# Patient Record
Sex: Male | Born: 1960
Health system: Southern US, Community
[De-identification: ages and names within clinical notes are randomized; demographics above are authoritative.]

## PROBLEM LIST (undated history)

## (undated) ENCOUNTER — Emergency Department: Discharge status: Absent without leave | Payer: 59

## (undated) DIAGNOSIS — M48062 Spinal stenosis, lumbar region with neurogenic claudication: Secondary | ICD-10-CM

## (undated) DIAGNOSIS — D72819 Decreased white blood cell count, unspecified: Secondary | ICD-10-CM

## (undated) DIAGNOSIS — H409 Unspecified glaucoma: Secondary | ICD-10-CM

## (undated) DIAGNOSIS — E785 Hyperlipidemia, unspecified: Secondary | ICD-10-CM

## (undated) DIAGNOSIS — R739 Hyperglycemia, unspecified: Secondary | ICD-10-CM

## (undated) HISTORY — DX: Unspecified glaucoma: H40.9

## (undated) HISTORY — DX: Hyperglycemia, unspecified: R73.9

## (undated) HISTORY — DX: Spinal stenosis, lumbar region with neurogenic claudication: M48.062

## (undated) HISTORY — DX: Hyperlipidemia, unspecified: E78.5

## (undated) HISTORY — DX: Decreased white blood cell count, unspecified: D72.819

---
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
## 2005-10-22 ENCOUNTER — Ambulatory Visit: Payer: Self-pay | Admitting: Internal Medicine

## 2006-01-04 ENCOUNTER — Encounter: Payer: Self-pay | Admitting: Family Medicine

## 2006-01-04 LAB — CONVERTED CEMR LAB: PSA: 1.43 ng/mL

## 2006-01-07 ENCOUNTER — Ambulatory Visit: Payer: Self-pay | Admitting: Family Medicine

## 2006-01-13 ENCOUNTER — Ambulatory Visit: Payer: Self-pay | Admitting: Family Medicine

## 2006-10-13 ENCOUNTER — Ambulatory Visit: Payer: Self-pay | Admitting: Family Medicine

## 2006-10-13 DIAGNOSIS — M545 Low back pain, unspecified: Secondary | ICD-10-CM | POA: Insufficient documentation

## 2006-10-13 DIAGNOSIS — L723 Sebaceous cyst: Secondary | ICD-10-CM | POA: Insufficient documentation

## 2006-11-10 ENCOUNTER — Telehealth: Payer: Self-pay | Admitting: Family Medicine

## 2006-11-12 ENCOUNTER — Encounter: Payer: Self-pay | Admitting: Family Medicine

## 2006-11-12 DIAGNOSIS — K219 Gastro-esophageal reflux disease without esophagitis: Secondary | ICD-10-CM | POA: Insufficient documentation

## 2006-11-14 ENCOUNTER — Ambulatory Visit: Payer: Self-pay | Admitting: Family Medicine

## 2007-07-03 ENCOUNTER — Ambulatory Visit: Payer: Self-pay | Admitting: Family Medicine

## 2007-07-21 ENCOUNTER — Ambulatory Visit: Payer: Self-pay | Admitting: Family Medicine

## 2007-07-24 LAB — CONVERTED CEMR LAB
ALT: 28 units/L (ref 0–53)
AST: 23 units/L (ref 0–37)
Albumin: 3.9 g/dL (ref 3.5–5.2)
Alkaline Phosphatase: 48 units/L (ref 39–117)
BUN: 12 mg/dL (ref 6–23)
Bilirubin, Direct: 0.1 mg/dL (ref 0.0–0.3)
CO2: 31 meq/L (ref 19–32)
Calcium: 9.1 mg/dL (ref 8.4–10.5)
Chloride: 104 meq/L (ref 96–112)
Cholesterol: 231 mg/dL (ref 0–200)
Creatinine, Ser: 1.1 mg/dL (ref 0.4–1.5)
Direct LDL: 171.1 mg/dL
GFR calc Af Amer: 92 mL/min
GFR calc non Af Amer: 76 mL/min
Glucose, Bld: 100 mg/dL — ABNORMAL HIGH (ref 70–99)
HDL: 41.8 mg/dL (ref >39.0)
PSA: 1.29 ng/mL (ref 0.10–4.00)
Potassium: 4.1 meq/L (ref 3.5–5.1)
Sodium: 141 meq/L (ref 135–145)
Total Bilirubin: 0.7 mg/dL (ref 0.3–1.2)
Total CHOL/HDL Ratio: 5.5
Total Protein: 7.1 g/dL (ref 6.0–8.3)
Triglycerides: 142 mg/dL (ref 0–149)
VLDL: 28 mg/dL (ref 0–40)

## 2007-10-28 ENCOUNTER — Ambulatory Visit: Payer: Self-pay | Admitting: Family Medicine

## 2007-11-05 LAB — CONVERTED CEMR LAB
Cholesterol: 221 mg/dL (ref 0–200)
Direct LDL: 175.5 mg/dL
HDL: 45.2 mg/dL (ref >39.0)
Total CHOL/HDL Ratio: 4.9
Triglycerides: 71 mg/dL (ref 0–149)
VLDL: 14 mg/dL (ref 0–40)

## 2008-02-02 ENCOUNTER — Ambulatory Visit: Payer: Self-pay | Admitting: Family Medicine

## 2008-02-04 LAB — CONVERTED CEMR LAB
ALT: 35 units/L (ref 0–53)
AST: 25 units/L (ref 0–37)
Cholesterol: 258 mg/dL (ref 0–200)
Direct LDL: 176.3 mg/dL
HDL: 61.8 mg/dL (ref >39.0)
Total CHOL/HDL Ratio: 4.2
Triglycerides: 61 mg/dL (ref 0–149)
VLDL: 12 mg/dL (ref 0–40)

## 2008-05-26 ENCOUNTER — Ambulatory Visit: Payer: Self-pay | Admitting: Family Medicine

## 2008-05-27 LAB — CONVERTED CEMR LAB
ALT: 33 U/L
AST: 23 U/L
Cholesterol: 162 mg/dL
HDL: 45.7 mg/dL
LDL Cholesterol: 102 mg/dL — ABNORMAL HIGH
Total CHOL/HDL Ratio: 4
Triglycerides: 70 mg/dL
VLDL: 14 mg/dL

## 2008-06-21 ENCOUNTER — Ambulatory Visit: Payer: Self-pay | Admitting: Family Medicine

## 2008-06-21 DIAGNOSIS — J301 Allergic rhinitis due to pollen: Secondary | ICD-10-CM | POA: Insufficient documentation

## 2008-06-21 DIAGNOSIS — J309 Allergic rhinitis, unspecified: Secondary | ICD-10-CM | POA: Insufficient documentation

## 2008-12-09 ENCOUNTER — Ambulatory Visit: Payer: Self-pay | Admitting: Family Medicine

## 2008-12-09 DIAGNOSIS — M79609 Pain in unspecified limb: Secondary | ICD-10-CM | POA: Insufficient documentation

## 2008-12-09 DIAGNOSIS — R42 Dizziness and giddiness: Secondary | ICD-10-CM | POA: Insufficient documentation

## 2008-12-14 ENCOUNTER — Ambulatory Visit: Payer: Self-pay | Admitting: Family Medicine

## 2008-12-15 ENCOUNTER — Encounter (INDEPENDENT_AMBULATORY_CARE_PROVIDER_SITE_OTHER): Payer: Self-pay | Admitting: *Deleted

## 2008-12-15 LAB — CONVERTED CEMR LAB: Fecal Occult Bld: NEGATIVE

## 2010-03-06 NOTE — Assessment & Plan Note (Signed)
Summary: CPX/CLE   Vital Signs:  Patient profile:   50 year old male Height:      70 inches Weight:      215.0 pounds BMI:     30.96 Temp:     98.2 degrees F oral Pulse rate:   60 / minute Pulse rhythm:   regular BP sitting:   120 / 82  (left arm) Cuff size:   large  Vitals Entered By: Benny Lennert CMA (AAMA) (December 09, 2008 2:02 PM)  History of Present Illness: Chief complain cpx The patient is here for annual wellness exam and preventative care.      In past week has been having sore throat, congestion, chills. No cough, no SOB. Slight subjectivefever.  No specific sick contacts. No allergy symptoms.  Allegra has helped some   Continues to have occassional lightheadedness. Occurs when sitting occ with stabding.Marland Kitchendescribes as feeling off balance.   Has been having several weeks of calf tightness. Worst when getting up in AM. No cramps.    Problems Prior to Update: 1)  Allergic Rhinitis  (ICD-477.9) 2)  Uri  (ICD-465.9) 3)  Screening For Lipoid Disorders  (ICD-V77.91) 4)  Special Screening Malignant Neoplasm of Prostate  (ICD-V76.44) 5)  Well Woman  (ICD-V70.0) 6)  Obesity, Bmi 30  (ICD-278.00) 7)  Hyperlipidemia  (ICD-272.4) 8)  Gerd  (ICD-530.81) 9)  Asthma, Childhood  (ICD-493.90) 10)  Back Pain, Lumbar  (ICD-724.2) 11)  Sebaceous Cyst  (ICD-706.2)  Current Medications (verified): 1)  Multivitamins   Tabs (Multiple Vitamin) .... Once Daily 2)  Omega-3 350 Mg  Caps (Omega-3 Fatty Acids) .... Once Daily 3)  Prevacid 15 Mg  Cpdr (Lansoprazole) .... As Needed 4)  Simvastatin 40 Mg Tabs (Simvastatin) .Marland Kitchen.. 1 Tab By Mouth Daily 5)  Allegra 180 Mg Tabs (Fexofenadine Hcl) .Marland Kitchen.. 1 Once Daily For Allergic Rhinitis 6)  Sudafed 120mg   Allergies: 1)  ! * Hard Shell Fish// Shrimp/lobster/crabs  Past History:  Past medical, surgical, family and social histories (including risk factors) reviewed, and no changes noted (except as noted below).  Past Medical  History: Reviewed history from 11/12/2006 and no changes required. Asthma GERD Hyperlipidemia  Past Surgical History: Reviewed history from 11/12/2006 and no changes required. 2006    EGD (-) 1970's Hernia op. 2007    MRI, left knee  Family History: Reviewed history from 11/12/2006 and no changes required. Father: Alive 43, healthy Mother: Alive 18, HTN Siblings: 5 brothers, 2 sisters with HTN No MI < 55 Lung CA:  MGM  Social History: Reviewed history from 11/12/2006 and no changes required. Never Smoked Alcohol use-yes, 1-2 beers/day, some liquor on weekend (1-2 drinks) Drug use-no Regular exercise-yes, walking daily 1 mile Marital Status: Married x 25 years Children: 3 kids, ages 44 - 20 Occupation: Home improvement Diet:  Varied, no FF, fruit and veggies  Review of Systems General:  Denies fatigue and fever. CV:  Denies chest pain or discomfort. Resp:  Denies shortness of breath. GI:  Denies abdominal pain, bloody stools, constipation, and diarrhea. GU:  Denies dysuria.  Physical Exam  General:  Well-developed,well-nourished,in no acute distress; alert,appropriate and cooperative throughout examination Head:  Normocephalic and atraumatic without obvious abnormalities. No apparent alopecia or balding. Eyes:  No corneal or conjunctival inflammation noted. EOMI. Perrla. Funduscopic exam benign, without hemorrhages, exudates or papilledema. Vision grossly normal. Ears:  clear fluid B TMS Nose:  nasal dischargemucosal pallor.   Mouth:  post nassal drip, oropharyngeal erythema Neck:  no carotid  bruit or thyromegaly no cervical or supraclavicular lymphadenopathy  Lungs:  Normal respiratory effort, chest expands symmetrically. Lungs are clear to auscultation, no crackles or wheezes. Heart:  Normal rate and regular rhythm. S1 and S2 normal without gallop, murmur, click, rub or other extra sounds. Abdomen:  Bowel sounds positive,abdomen soft and non-tender without masses,  organomegaly or hernias noted. Rectal:  No external abnormalities noted. Normal sphincter tone. No rectal masses or tenderness. Genitalia:  Testes bilaterally descended without nodularity, tenderness or masses. No scrotal masses or lesions. No penis lesions or urethral discharge. Prostate:  Prostate gland firm and smooth, no enlargement, nodularity, tenderness, mass, asymmetry or induration. Msk:  No deformity or scoliosis noted of thoracic or lumbar spine.   Pulses:  R and L posterior tibial pulses are full and equal bilaterally  Extremities:  no edema Neurologic:  No cranial nerve deficits noted. Station and gait are normal. Plantar reflexes are down-going bilaterally. DTRs are symmetrical throughout. Sensory, motor and coordinative functions appear intact. Skin:  Intact without suspicious lesions or rashes Psych:  Cognition and judgment appear intact. Alert and cooperative with normal attention span and concentration. No apparent delusions, illusions, hallucinations   Impression & Recommendations:  Problem # 1:  PREVENTIVE HEALTH CARE (ICD-V70.0)  Reviewed preventive care protocols, scheduled due services, and updated immunizations. Encouraged exercise, weight loss, healthy eating habits.   Problem # 2:  DIZZINESS (ICD-780.4) Unclear if inner ear issue/due to allergies...will check Hg and TSH. Take allegra regularly..call if not improving.  His updated medication list for this problem includes:    Allegra 180 Mg Tabs (Fexofenadine hcl) .Marland Kitchen... 1 once daily for allergic rhinitis  Problem # 3:  HYPERLIPIDEMIA (ICD-272.4) Previously well controlled on simvastatin, due for reeval.  His updated medication list for this problem includes:    Simvastatin 40 Mg Tabs (Simvastatin) .Marland Kitchen... 1 tab by mouth daily  Problem # 4:  CALF PAIN, BILATERAL (ICD-729.5) ? secondary to simvastain. Will check electrolytes, TSH, push fluids.   Complete Medication List: 1)  Multivitamins Tabs (Multiple vitamin)  .... Once daily 2)  Omega-3 350 Mg Caps (Omega-3 fatty acids) .... Once daily 3)  Prevacid 15 Mg Cpdr (Lansoprazole) .... As needed 4)  Simvastatin 40 Mg Tabs (Simvastatin) .Marland Kitchen.. 1 tab by mouth daily 5)  Allegra 180 Mg Tabs (Fexofenadine hcl) .Marland Kitchen.. 1 once daily for allergic rhinitis 6)  Sudafed 120mg    Patient Instructions: 1)  Fasting lipids, CMET Dx 272.0, PSA Dx v76.44, cbc, TSH Dx 780.4 2)  Take allegra daily through allergy season.  3)  Stretch legs regularly. 4)  Push fluids. 5)  Please schedule a follow-up appointment in 1 year, or earlier if not improving.   Current Allergies (reviewed today): ! * HARD SHELL FISH// SHRIMP/LOBSTER/CRABS  Flu Vaccine Next Due:  Refused Flex Sig Next Due:  Not Indicated Colonoscopy Next Due:  Refused

## 2010-03-06 NOTE — Assessment & Plan Note (Signed)
Summary: CPX/CLE   Vital Signs:  Patient Profile:   50 Years Old Male Height:     70 inches (175.26 cm) Weight:      215 pounds BMI:     30.96 Temp:     97.8 degrees F oral Pulse rate:   64 / minute Pulse rhythm:   regular BP sitting:   140 / 70  (left arm) Cuff size:   large  Vitals Entered ByProvidence Crosby (Jul 03, 2007 11:21 AM)                 Chief Complaint:  check up.     Prior Medications Reviewed Using: Patient Recall  Current Allergies (reviewed today): ! * HARD SHELL FISH// SHRIMP/LOBSTER/CRABS  Past Medical History:    Reviewed history from 11/12/2006 and no changes required:       Asthma       GERD       Hyperlipidemia   Family History:    Reviewed history from 11/12/2006 and no changes required:       Father: Alive 75, healthy       Mother: Alive 14, HTN       Siblings: 5 brothers, 2 sisters with HTN       No MI < 55       Lung CA:  MGM  Social History:    Reviewed history from 11/12/2006 and no changes required:       Never Smoked       Alcohol use-yes, 1-2 beers/day, some liquor on weekend (1-2 drinks)       Drug use-no       Regular exercise-yes, walking daily 1 mile       Marital Status: Married x 25 years       Children: 3 kids, ages 12 - 49       Occupation: Home improvement       Diet:  Varied, no FF, fruit and veggies    Review of Systems       some abdoinal soreness when riding in car  saw ortho about left foot, negative x-rays, better now  General      Denies fatigue and fever.  Eyes      glaucoma per eye doc  ENT      Denies decreased hearing.  CV      Denies chest pain or discomfort.  Resp      Denies shortness of breath.  GI      Complains of abdominal pain.      Denies bloody stools, constipation, diarrhea, gas, hemorrhoids, indigestion, nausea, and vomiting.  GU      Denies dysuria and urinary frequency.  Derm      Denies rash.  Psych      Denies anxiety and depression.   Physical  Exam  General:     Well-developed,well-nourished,in no acute distress; alert,appropriate and cooperative throughout examination Eyes:     No corneal or conjunctival inflammation noted. EOMI. Perrla. Vision grossly normal. Ears:     External ear exam shows no significant lesions or deformities.  Otoscopic examination reveals clear canals, tympanic membranes are intact bilaterally without bulging, retraction, inflammation or discharge. Hearing is grossly normal bilaterally. Nose:     External nasal examination shows no deformity or inflammation. Nasal mucosa are pink and moist without lesions or exudates. Mouth:     Oral mucosa and oropharynx without lesions or exudates.  Teeth in good repair. Neck:     no carotid  bruit or thyromegaly  Lungs:     Normal respiratory effort, chest expands symmetrically. Lungs are clear to auscultation, no crackles or wheezes. Heart:     Normal rate and regular rhythm. S1 and S2 normal without gallop, murmur, click, rub or other extra sounds. Abdomen:     Bowel sounds positive,abdomen soft and non-tender without masses, organomegaly or hernias noted. Rectal:     No external abnormalities noted. Normal sphincter tone. No rectal masses or tenderness. Prostate:     Prostate gland firm and smooth, no enlargement, nodularity, tenderness, mass, asymmetry or induration. Pulses:     R and L posterior tibial pulses are full and equal bilaterally  Extremities:     No clubbing, cyanosis, edema, or deformity noted with normal full range of motion of all joints.   Neurologic:     No cranial nerve deficits noted. Station and gait are normal. Plantar reflexes are down-going bilaterally. DTRs are symmetrical throughout. Sensory, motor and coordinative functions appear intact. Cervical Nodes:     No lymphadenopathy noted    Impression & Recommendations:  Problem # 1:  Preventive Health Care (ICD-V70.0) Reviewed preventive care protocols, scheduled due services, and  updated immunizations.   Complete Medication List: 1)  Multivitamins Tabs (Multiple vitamin) .... Once daily 2)  Omega-3 350 Mg Caps (Omega-3 fatty acids) .... Once daily 3)  Prevacid 15 Mg Cpdr (Lansoprazole) .... As needed 4)  Meloxicam 7.5 Mg Tabs (Meloxicam) .... Take 1 tablet by mouth every 12 hours  Anticoagulation Management Assessment/Plan:       Patient Instructions: 1)  Fasting LIPIDs, CMET Dx v77.91, PSA [email or web address]   ]

## 2010-03-06 NOTE — Assessment & Plan Note (Signed)
Summary: back pain no better/lsf   Vital Signs:  Patient Profile:   50 Years Old Male Height:     69 inches (175.26 cm) Weight:      215.38 pounds Temp:     97.8 degrees F oral Pulse rate:   64 / minute Pulse rhythm:   regular BP sitting:   146 / 84  (left arm) Cuff size:   large  Vitals Entered By: Delilah Shan (November 14, 2006 9:30 AM)                 Chief Complaint:  Back pain no better.  History of Present Illness: lumbar back pain: s/p diclofenac, heat, occ gentle stretches helped some, but continues 5/10 now has low back pain, worse with walking some pain radiating down both thighs increased pain with trying to stand up from bent over pain relieved some when bends leg when standing no numbness and tingling no muscle weakness no fever no incontinence  Current Allergies (reviewed today): No known allergies   Past Medical History:    Reviewed history from 11/12/2006 and no changes required:       Asthma       GERD       Hyperlipidemia   Family History:    Reviewed history from 11/12/2006 and no changes required:       Father: Alive 38, healthy       Mother: Alive 44, HTN       Siblings: 5 brothers, 2 sisters with HTN       No MI < 55       Lung CA:  MGM     Physical Exam  General:     Well-developed,well-nourished,in no acute distress; alert,appropriate and cooperative throughout examination Lungs:     Normal respiratory effort, chest expands symmetrically. Lungs are clear to auscultation, no crackles or wheezes. Heart:     Normal rate and regular rhythm. S1 and S2 normal without gallop, murmur, click, rub or other extra sounds. Neurologic:     No cranial nerve deficits noted. Station and gait are normal.  DTRs are patellar Sensory, motor and coordinative functions appear intact.   Detailed Back/Spine Exam  Lumbosacral Exam:  Inspection-deformity:    Normal Palpation-spinal tenderness:  Normal    ttp over left paraspinous muscle Range  of Motion:    Forward Flexion:   full degrees    Hyperextension:   full degrees    Right Lateral Bend:   full degrees    Left Lateral Bend:   full degrees Lying Straight Leg Raise:    Right:  negative    Left:  negative Sitting Straight Leg Raise:    Right:  negative    Left:  negative Fabere Test:    Right:  negative    Left:  negative    Impression & Recommendations:  Problem # 1:  BACK PAIN, LUMBAR (ICD-724.2) muscle strain.  Treat with muscle relaxant and meloxicam.  Contnue heat, start stretching.  Offered PT, refused.  Call if pain continues. The following medications were removed from the medication list:    Diclofenac Sodium 75 Mg Tbec (Diclofenac sodium) .Marland Kitchen... 1 tab by mouth two times a day as needed pain    Mobic 7.5 Mg Tabs (Meloxicam) .Marland Kitchen... Take 1 tablet by mouth every 12 hours  His updated medication list for this problem includes:    Meloxicam 7.5 Mg Tabs (Meloxicam) .Marland Kitchen... Take 1 tablet by mouth every 12 hours   Complete Medication List:  1)  Multivitamins Tabs (Multiple vitamin) .... Once daily 2)  Omega-3 350 Mg Caps (Omega-3 fatty acids) .... Once daily 3)  Prevacid 15 Mg Cpdr (Lansoprazole) .... As needed 4)  Meloxicam 7.5 Mg Tabs (Meloxicam) .... Take 1 tablet by mouth every 12 hours     Prescriptions: MELOXICAM 7.5 MG  TABS (MELOXICAM) Take 1 tablet by mouth every 12 hours  #45 x 0   Entered and Authorized by:   Kerby Nora MD   Signed by:   Kerby Nora MD on 11/14/2006   Method used:   Elect[facility] to ...       [facility]  [id number]       [address]       [place], [place]  [address]       Ph: [phone] or [phone]       Fax: [phone]   RxID:   [id number] MOBIC 7.5 MG  TABS (MELOXICAM) Take 1 tablet by mouth every 12 hours  #45 x 0   Entered and Authorized by:   Kerby Nora MD   Signed by:   Kerby Nora MD on 11/14/2006   Method used:   Electronically sent to ...       [facility]  [address]  [id number]       [address]       [place], [place]  [address]       Ph: [phone] or [phone]       Fax: [phone]   RxID:   [id number]  ] Current Allergies (reviewed today): No known allergies

## 2010-03-06 NOTE — Assessment & Plan Note (Signed)
Summary: KNOT ON BACK   Vital Signs:  Patient Profile:   50 Years Old Male Weight:      210.50 pounds Temp:     98.1 degrees F oral Pulse rate:   72 / minute Pulse rhythm:   regular BP sitting:   150 / 84  (left arm) Cuff size:   large  Vitals Entered By: Delilah Shan (October 13, 2006 4:22 PM)                 Chief Complaint:  Knot on back x 2-3 weeks.  History of Present Illness: knot on top part of back there for 3 weeks, nontender, no redness, no swelling no fever, chills, occ light headed  Has had MRI of cervical spine in the  past showing C5-C6 osteophyte, moderate neural foraminal stenosis, C6-C7 osteophyte  He has also strained his low back about 1 weeks ago doing moving.   mild tenderness across low back B. He denies numbness, tingling, weakness, fever. Hx/o low back pain  Current Allergies: No known allergies    Family History:    Reviewed history and no changes required:    Review of Systems      See HPI   Physical Exam  General:     Well-developed,well-nourished,in no acute distress; alert,appropriate and cooperative throughout examination Skin:     2-3 cm x 1cmnon erythematous subcutaneous fluctuant mass in upper back somewhat mobile   Detailed Back/Spine Exam  Lumbosacral Exam:  Inspection-deformity:    Normal Palpation-spinal tenderness:  Normal    ttp B erector spinae muscles Range of Motion:    Forward Flexion:   full degrees    Hyperextension:   full degrees    Right Lateral Bend:   full degrees    Left Lateral Bend:   full degrees Lying Straight Leg Raise:    Right:  negative    Left:  negative Sitting Straight Leg Raise:    Right:  negative    Left:  negative Fabere Test:    Right:  negative    Left:  negative    Impression & Recommendations:  Problem # 1:  SEBACEOUS CYST (ICD-706.2) not infected.  Pt reassured, no treatment unless infected.    Problem # 2:  BACK PAIN, LUMBAR (ICD-724.2) NSAIDs, heat,  stretches.  F/U in 2 weeks as needed. His updated medication list for this problem includes:    Diclofenac Sodium 75 Mg Tbec (Diclofenac sodium) .Marland Kitchen... 1 tab by mouth two times a day as needed pain   Complete Medication List: 1)  Diclofenac Sodium 75 Mg Tbec (Diclofenac sodium) .Marland Kitchen.. 1 tab by mouth two times a day as needed pain   Patient Instructions: 1)  Please schedule a follow-up appointment in 2  if back pain no better.    Prescriptions: DICLOFENAC SODIUM 75 MG  TBEC (DICLOFENAC SODIUM) 1 tab by mouth two times a day as needed pain  #30 x 0   Entered and Authorized by:   Kerby Nora MD   Signed by:   Kerby Nora MD on 10/13/2006   Method used:   Print then Give to Patient   RxID:   [id number]   Prior Medications (reviewed today): None Current Allergies: No known allergies

## 2010-03-06 NOTE — Letter (Signed)
Summary: Results Follow up Letter  [facility]  [address] [place], [place] [address]   Phone: [phone]  Fax: [phone]    12/15/2008 MRN: [id number]     [facility] [address] [place], [place]  [address]    Dear Jason Bradley,  The following are the results of your recent test(s):  Test         Result    Pap Smear:        Normal _____  Not Normal _____ Comments: ______________________________________________________ Cholesterol: LDL(Bad cholesterol):         Your goal is less than:         HDL (Good cholesterol):       Your goal is more than: Comments:  ______________________________________________________ Mammogram:        Normal _____  Not Normal _____ Comments:  ___________________________________________________________________ Hemoccult:        Normal ___x__  Not normal _______ Comments:  Repeat in 1 year  _____________________________________________________________________ Other Tests:    We routinely do not discuss normal results over the telephone.  If you desire a copy of the results, or you have any questions about this information we can discuss them at your next office visit.   Sincerely,   Kerby Nora MD

## 2010-03-06 NOTE — Assessment & Plan Note (Signed)
Summary: INFECTION/RBH   Vital Signs:  Patient profile:   50 year old male Height:      70 inches Weight:      208.75 pounds BMI:     30.06 Temp:     98.6 degrees F oral Pulse rate:   60 / minute Pulse rhythm:   regular BP sitting:   130 / 80  (left arm) Cuff size:   large  Vitals Entered By: Lewanda Rife (Jun 21, 2008 9:48 AM)  CC:  fever, sough, head congestion, and sorethroat since05/16/2010.  History of Present Illness: Here for URI signs x 1+ wk-ST--has improved, fever and chills at onset, nasal congestion --now with non-productive cough that keeps awake --has not missed wotrk. --son, 6 yrs was sick, then wife got it--didn't last this long  Allergies: 1)  ! * Hard Shell Fish// Shrimp/lobster/crabs  Review of Systems      See HPI  Physical Exam  General:  alert, well-developed, well-nourished, and well-hydrated.  NAD Ears:  TMs retracted with some fluid Nose:  no mucosal edema, no airflow obstruction, and mucosal erythema.   Mouth:  no exudates and pharyngeal erythema.     Impression & Recommendations:  Problem # 1:  URI (ICD-465.9) Assessment New continue comfort care measures: increase po fluids, rest, tylenol or IBP as needed will start allegra and add sudafed if needed see backif increased signs--agrees His updated medication list for this problem includes:    Meloxicam 7.5 Mg Tabs (Meloxicam) .Marland Kitchen... Take 1 tablet by mouth every 12 hours    Allegra 180 Mg Tabs (Fexofenadine hcl) .Marland Kitchen... 1 once daily for allergic rhinitis  Problem # 2:  ALLERGIC RHINITIS (ICD-477.9) Assessment: Unchanged has hx of seasonal allergic rhinitis--has run out of Allegra--works better than claritin will refill allegra see back if not imp His updated medication list for this problem includes:    Allegra 180 Mg Tabs (Fexofenadine hcl) .Marland Kitchen... 1 once daily for allergic rhinitis  Complete Medication List: 1)  Multivitamins Tabs (Multiple vitamin) .... Once daily 2)  Omega-3 350 Mg  Caps (Omega-3 fatty acids) .... Once daily 3)  Prevacid 15 Mg Cpdr (Lansoprazole) .... As needed 4)  Meloxicam 7.5 Mg Tabs (Meloxicam) .... Take 1 tablet by mouth every 12 hours 5)  Simvastatin 40 Mg Tabs (Simvastatin) .Marland Kitchen.. 1 tab by mouth daily 6)  Allegra 180 Mg Tabs (Fexofenadine hcl) .Marland Kitchen.. 1 once daily for allergic rhinitis 7)  Sudafed 120mg   Prescriptions: ALLEGRA 180 MG TABS (FEXOFENADINE HCL) 1 once daily for allergic rhinitis  #30 x 6   Entered and Authorized by:   Gildardo Griffes FNP   Signed by:   Gildardo Griffes FNP on 06/21/2008   Method used:   [facility]        [id number]       [address]       [place], [place]  [address]       Ph: [phone]       Fax: [phone]   RxID:   [id number]

## 2010-03-06 NOTE — Progress Notes (Signed)
Summary: back pain  Phone Note Call from Patient Call back at [phone]   Caller: Patient Call For: bedsole Summary of Call: pt was seen 3 weeks ago for a back problem, med prescribed hasn't helped and he is requesting a stronger med for back pain. Initial call taken by: Liane Comber,  November 10, 2006 1:58 PM  Follow-up for Phone Call        needs F/u appt. Follow-up by: Kerby Nora MD,  November 10, 2006 2:13 PM  Additional Follow-up for Phone Call Additional follow up Details #1::        Patient Advised. Appointment scheduled  11/14/06 at 9:30 a.m. Additional Follow-up by: Delilah Shan,  November 10, 2006 2:53 PM

## 2010-06-22 NOTE — Assessment & Plan Note (Signed)
Jason Bradley                             [facility] OFFICE NOTE   NAME:Jason Bradley, Bradley                            MRN:          [id number]  DATE:10/22/2005                            DOB:          [birth year]    CHIEF COMPLAINT:  A 50 year old black male here to establish new doctor.   HISTORY OF PRESENT ILLNESS:  Jason Bradley is a relatively healthy 50 year old  male who moved here from [place] about seven months ago.  He had started a  new job where he was exposed to fumes.  Although he was wearing a mask, he  noticed that he was having some chest tightness after he returned from work.  These symptoms gradually reduced over the evening and then were gone by the  next morning.  He would then return to work and noted some recurrent chest  tightness.  He stopped his job about four days ago and has noticed some  gradual improvement.   He also states he has a history of gastroesophageal reflux disease.  He has  been out of Prevacid for two to three months.  He feels that he is not sure  the symptoms he has been noticing of chest tightness may actually be stomach  spasms over the lower part of his chest and upper of his epigastric abdomen.  He states that sometimes when he is just relaxing laying back on the couch  he feels spasms in his stomach.  They improve with walking around.  He  denies chest pain, shortness of breath.  These symptoms are intermittent.  Nothing seems to make them worse such as exertion or deep breath.   He also reports that he has had a history of allergies and was on Allegra in  the past.  He states that he was sneezing a significant amount earlier in  the year when he first moved here but has had some improvement in his  symptoms.  He denied itchy eyes and sneezed currently.   PAST MEDICAL HISTORY:  1. GERD.  2. Childhood asthma.  3. Hypercholesterolemia.   HOSPITALIZATION/SURGERIES/PROCEDURES:  1. In 2006, EGD negative.  2. In  1970, hernia operation.  3. MRI of left knee 2007.   ALLERGIES:  None.   MEDICATIONS:  1. Multivitamin daily.  2. Prevacid p.r.n.   REVIEW OF SYSTEMS:  Otherwise negative.   FAMILY HISTORY:  Father alive at around age 36 healthy.  Mother alive at age  56 with hypertension.  No family history of MI less than age 22.  He has 5  brothers and 2 sisters with some of the siblings having hypertension.  There  is no family history of cancer other than a maternal grandmother having lung  cancer.  There is no family history of diabetes.   SOCIAL HISTORY:  He does not smoke, does not use other drugs but does drink  occasional alcohol about one to two beers per day.  He has some liquor on  the weekend about 1-2 drinks.  He currently works in home improvement.  He  has been married for 25 years.  He has 3 kids 36 to 76 years old.  He walks  daily about one mile.  He tries to get varied fruits and vegetables and  avoids fast food.   PHYSICAL EXAMINATION:  Height 69 inches, weight 210 making BMI 30.  Blood  pressure 130/80, pulse 72, temperature 98.2.  GENERAL:  An overweight-appearing male in no apparent distress.  HEENT:  PERRLA.  Extraocular muscles intact.  Oropharynx clear.  Nares with  pale turbinates bilaterally.  No thyromegaly.  No lymphadenopathy, cervical  or supraclavicular.  PULMONARY:  Clear to auscultation bilaterally.  No wheezes, rales or  rhonchi.  CARDIOVASCULAR:  Regular rate and rhythm.  No murmurs, gallops, or rubs.  CHEST:  Chest wall nontender to palpation.  Normal PMI.  2+ peripheral  pulses.  No peripheral edema.  ABDOMEN:  Soft and nontender.  Normoactive bowel sounds.  No  hepatosplenomegaly.  MUSCULOSKELETAL:  Strength 5/5 in upper and lower extremities.  NEUROLOGICAL:  Alert and oriented x3.  Cranial nerves II through XII grossly  intact.   ASSESSMENT/PLAN:  1. Gastroesophageal reflux disease:  This could be the cause of some his      stomach cramping  symptoms.  Given his past problems with this, we will      restart him on Prevacid at 1 tablet p.o. daily times approximately 8      weeks.  He will let me know if this improves his abdominal cramping      symptoms.  2. Allergic rhinitis:  He does have physical exam findings suggesting      allergies.  We will restart him on Allegra 180 mg daily.  3. Chest tightness:  We performed a peak flow today showing his measured      peak flow at around 500.  His predicted is between 466 and 583.  This      suggest that he does not have asthmatic features.  His chest tightness      may be secondary to allergies and we will treat as stated above with      Allegra.  I have encouraged him to avoid fumes that seem to worsen his      symptoms.  If they continue over the next few weeks, he will return to      clinic today to discuss further workup.  We can consider full pulmonary      function test if need be or chest x-ray.                                   Kerby Nora, MD   AB/MedQ  DD:  10/22/2005  DT:  10/23/2005  Job #:  [id number]

## 2011-01-10 LAB — HM COLONOSCOPY

## 2016-04-23 ENCOUNTER — Encounter (INDEPENDENT_AMBULATORY_CARE_PROVIDER_SITE_OTHER): Payer: Self-pay

## 2016-04-23 ENCOUNTER — Encounter: Payer: Self-pay | Admitting: Internal Medicine

## 2016-04-23 ENCOUNTER — Ambulatory Visit (INDEPENDENT_AMBULATORY_CARE_PROVIDER_SITE_OTHER): Payer: 59 | Admitting: Internal Medicine

## 2016-04-23 VITALS — BP 138/86 | HR 50 | Temp 97.5°F | Ht 69.0 in | Wt 215.0 lb

## 2016-04-23 DIAGNOSIS — Z0000 Encounter for general adult medical examination without abnormal findings: Secondary | ICD-10-CM | POA: Diagnosis not present

## 2016-04-23 DIAGNOSIS — E785 Hyperlipidemia, unspecified: Secondary | ICD-10-CM

## 2016-04-23 DIAGNOSIS — M48062 Spinal stenosis, lumbar region with neurogenic claudication: Secondary | ICD-10-CM

## 2016-04-23 LAB — COMPREHENSIVE METABOLIC PANEL
ALT: 23 U/L (ref 0–53)
AST: 19 U/L (ref 0–37)
Albumin: 4.1 g/dL (ref 3.5–5.2)
Alkaline Phosphatase: 47 U/L (ref 39–117)
BUN: 14 mg/dL (ref 6–23)
CO2: 29 mEq/L (ref 19–32)
Calcium: 9.6 mg/dL (ref 8.4–10.5)
Chloride: 105 mEq/L (ref 96–112)
Creatinine, Ser: 0.95 mg/dL (ref 0.40–1.50)
GFR: 104.75 mL/min (ref >60.00)
Glucose, Bld: 90 mg/dL (ref 70–99)
Potassium: 4.1 mEq/L (ref 3.5–5.1)
Sodium: 139 mEq/L (ref 135–145)
Total Bilirubin: 0.5 mg/dL (ref 0.2–1.2)
Total Protein: 7.2 g/dL (ref 6.0–8.3)

## 2016-04-23 LAB — CBC WITH DIFFERENTIAL/PLATELET
Basophils Absolute: 0 K/uL (ref 0.0–0.1)
Basophils Relative: 1.3 % (ref 0.0–3.0)
Eosinophils Absolute: 0.2 K/uL (ref 0.0–0.7)
Eosinophils Relative: 4.4 % (ref 0.0–5.0)
HCT: 47.5 % (ref 39.0–52.0)
Hemoglobin: 15.8 g/dL (ref 13.0–17.0)
Lymphocytes Relative: 40.3 % (ref 12.0–46.0)
Lymphs Abs: 1.4 K/uL (ref 0.7–4.0)
MCHC: 33.2 g/dL (ref 30.0–36.0)
MCV: 94.2 fl (ref 78.0–100.0)
Monocytes Absolute: 0.5 K/uL (ref 0.1–1.0)
Monocytes Relative: 14 % — ABNORMAL HIGH (ref 3.0–12.0)
Neutro Abs: 1.4 K/uL (ref 1.4–7.7)
Neutrophils Relative %: 40 % — ABNORMAL LOW (ref 43.0–77.0)
Platelets: 254 K/uL (ref 150.0–400.0)
RBC: 5.04 Mil/uL (ref 4.22–5.81)
RDW: 13.4 % (ref 11.5–15.5)
WBC: 3.5 K/uL — ABNORMAL LOW (ref 4.0–10.5)

## 2016-04-23 LAB — LIPID PANEL
Cholesterol: 241 mg/dL — ABNORMAL HIGH (ref 0–200)
HDL: 56.8 mg/dL
LDL Cholesterol: 167 mg/dL — ABNORMAL HIGH (ref 0–99)
NonHDL: 184.14
Total CHOL/HDL Ratio: 4
Triglycerides: 88 mg/dL (ref 0.0–149.0)
VLDL: 17.6 mg/dL (ref 0.0–40.0)

## 2016-04-23 NOTE — Assessment & Plan Note (Signed)
Holding off on Rx for now

## 2016-04-23 NOTE — Assessment & Plan Note (Signed)
Had normal colon 2012--will defer further testing Discussed healthy eating---DASH Increase exercise Yearly flu vaccine PSA at urologist

## 2016-04-23 NOTE — Progress Notes (Signed)
Pre visit review using our clinic review tool, if applicable. No additional management support is needed unless otherwise documented below in the visit note. 

## 2016-04-23 NOTE — Progress Notes (Signed)
Subjective:    Patient ID: Jason Bradley, male    DOB: Mar 01, 1960, 56 y.o.   MRN: [id number]  HPI Here to establish care Not seeing consistent provider at the last place  Has spine problems--- spinal stenosis with some neurogenic claudication and numbness Not overly limiting Notes it in stores when shops with wife--sometimes when does lawn  High cholesterol Takes red yeast rice and fish oil for this Tries to do regular exercise---walking, etc  Has lipoma in back No pain but notices it if he sits back on hard surface  Glaucoma noted Controlled on timolol  No current outpatient prescriptions on file prior to visit.   No current facility-administered medications on file prior to visit.     Allergies  Allergen Reactions  . Shellfish Allergy Swelling    Past Medical History:  Diagnosis Date  . Glaucoma   . Hyperlipemia   . Spinal stenosis of lumbar region with neurogenic claudication     Past Surgical History:  Procedure Laterality Date  . HERNIA REPAIR  1972    Family History  Problem Relation Age of Onset  . Arthritis Mother   . Hypercholesterolemia Mother   . Hypertension Mother     Social History   Social History  . Marital status: Married    Spouse name: N/A  . Number of children: 3  . Years of education: N/A   Occupational History  . Maintenance technician    Social History Main Topics  . Smoking status: Never Smoker  . Smokeless tobacco: Never Used  . Alcohol use Yes     Comment: occasional  . Drug use: Unknown  . Sexual activity: Not on file   Other Topics Concern  . Not on file   Social History Narrative  . No narrative on file    Review of Systems  Constitutional: Negative for fatigue and unexpected weight change.       Wears seat belt  HENT: Negative for dental problem, hearing loss and tinnitus.        Keeps up with dentist  Eyes:       Has prisms for diplopia  Respiratory: Negative for choking, chest tightness and shortness of  breath.   Cardiovascular: Negative for chest pain, palpitations and leg swelling.  Gastrointestinal: Negative for abdominal pain, blood in stool and constipation.  Endocrine: Negative for polydipsia and polyuria.  Genitourinary: Positive for frequency. Negative for difficulty urinating.       Stream okay Goes a lot if he drinks a lot No sexual problems Gets PSA with Dr Gaynelle Arabian  Musculoskeletal: Positive for back pain.  Skin: Negative for rash.       No suspicious lesions  Allergic/Immunologic: Positive for environmental allergies. Negative for immunocompromised state.       Allegra in the past  Neurological: Positive for headaches. Negative for dizziness and light-headedness.  Hematological: Negative for adenopathy. Does not bruise/bleed easily.  Psychiatric/Behavioral: Negative for sleep disturbance.       Occ down times (brother recently died) and mild anxiety       Objective:   Physical Exam  Constitutional: He is oriented to person, place, and time. He appears well-nourished. No distress.  HENT:  Head: Normocephalic and atraumatic.  Right Ear: External ear normal.  Left Ear: External ear normal.  Mouth/Throat: Oropharynx is clear and moist. No oropharyngeal exudate.  Eyes: Conjunctivae are normal. Pupils are equal, round, and reactive to light.  Neck: Normal range of motion. Neck supple. No thyromegaly present.  Cardiovascular: Normal rate, regular rhythm, normal heart sounds and intact distal pulses.  Exam reveals no gallop.   No murmur heard. Pulmonary/Chest: Effort normal and breath sounds normal. No respiratory distress. He has no wheezes. He has no rales.  Abdominal: Soft. There is no tenderness.  Musculoskeletal: Normal range of motion. He exhibits no edema or tenderness.  Lymphadenopathy:    He has no cervical adenopathy.  Neurological: He is alert and oriented to person, place, and time.  Skin: No rash noted. No erythema.  Large cyst on mid upper back    Psychiatric: He has a normal mood and affect. His behavior is normal.          Assessment & Plan:

## 2016-04-23 NOTE — Assessment & Plan Note (Signed)
Had muscle pain with one statin (?lipitor) If still very high--will try generic crestor

## 2016-04-23 NOTE — Patient Instructions (Signed)
DASH Eating Plan DASH stands for "Dietary Approaches to Stop Hypertension." The DASH eating plan is a healthy eating plan that has been shown to reduce high blood pressure (hypertension). It may also reduce your risk for type 2 diabetes, heart disease, and stroke. The DASH eating plan may also help with weight loss. What are tips for following this plan? General guidelines  Avoid eating more than 2,300 mg (milligrams) of salt (sodium) a day. If you have hypertension, you may need to reduce your sodium intake to 1,500 mg a day.  Limit alcohol intake to no more than 1 drink a day for nonpregnant women and 2 drinks a day for men. One drink equals 12 oz of beer, 5 oz of wine, or 1 oz of hard liquor.  Work with your health care provider to maintain a healthy body weight or to lose weight. Ask what an ideal weight is for you.  Get at least 30 minutes of exercise that causes your heart to beat faster (aerobic exercise) most days of the week. Activities may include walking, swimming, or biking.  Work with your health care provider or diet and nutrition specialist (dietitian) to adjust your eating plan to your individual calorie needs. Reading food labels  Check food labels for the amount of sodium per serving. Choose foods with less than 5 percent of the Daily Value of sodium. Generally, foods with less than 300 mg of sodium per serving fit into this eating plan.  To find whole grains, look for the word "whole" as the first word in the ingredient list. Shopping  Buy products labeled as "low-sodium" or "no salt added."  Buy fresh foods. Avoid canned foods and premade or frozen meals. Cooking  Avoid adding salt when cooking. Use salt-free seasonings or herbs instead of table salt or sea salt. Check with your health care provider or pharmacist before using salt substitutes.  Do not fry foods. Cook foods using healthy methods such as baking, boiling, grilling, and broiling instead.  Cook with  heart-healthy oils, such as olive, canola, soybean, or sunflower oil. Meal planning   Eat a balanced diet that includes: ? 5 or more servings of fruits and vegetables each day. At each meal, try to fill half of your plate with fruits and vegetables. ? Up to 6-8 servings of whole grains each day. ? Less than 6 oz of lean meat, poultry, or fish each day. A 3-oz serving of meat is about the same size as a deck of cards. One egg equals 1 oz. ? 2 servings of low-fat dairy each day. ? A serving of nuts, seeds, or beans 5 times each week. ? Heart-healthy fats. Healthy fats called Omega-3 fatty acids are found in foods such as flaxseeds and coldwater fish, like sardines, salmon, and mackerel.  Limit how much you eat of the following: ? Canned or prepackaged foods. ? Food that is high in trans fat, such as fried foods. ? Food that is high in saturated fat, such as fatty meat. ? Sweets, desserts, sugary drinks, and other foods with added sugar. ? Full-fat dairy products.  Do not salt foods before eating.  Try to eat at least 2 vegetarian meals each week.  Eat more home-cooked food and less restaurant, buffet, and fast food.  When eating at a restaurant, ask that your food be prepared with less salt or no salt, if possible. What foods are recommended? The items listed may not be a complete list. Talk with your dietitian about what   dietary choices are best for you. Grains Whole-grain or whole-wheat bread. Whole-grain or whole-wheat pasta. Brown rice. Oatmeal. Quinoa. Bulgur. Whole-grain and low-sodium cereals. Pita bread. Low-fat, low-sodium crackers. Whole-wheat flour tortillas. Vegetables Fresh or frozen vegetables (raw, steamed, roasted, or grilled). Low-sodium or reduced-sodium tomato and vegetable juice. Low-sodium or reduced-sodium tomato sauce and tomato paste. Low-sodium or reduced-sodium canned vegetables. Fruits All fresh, dried, or frozen fruit. Canned fruit in natural juice (without  added sugar). Meat and other protein foods Skinless chicken or turkey. Ground chicken or turkey. Pork with fat trimmed off. Fish and seafood. Egg whites. Dried beans, peas, or lentils. Unsalted nuts, nut butters, and seeds. Unsalted canned beans. Lean cuts of beef with fat trimmed off. Low-sodium, lean deli meat. Dairy Low-fat (1%) or fat-free (skim) milk. Fat-free, low-fat, or reduced-fat cheeses. Nonfat, low-sodium ricotta or cottage cheese. Low-fat or nonfat yogurt. Low-fat, low-sodium cheese. Fats and oils Soft margarine without trans fats. Vegetable oil. Low-fat, reduced-fat, or light mayonnaise and salad dressings (reduced-sodium). Canola, safflower, olive, soybean, and sunflower oils. Avocado. Seasoning and other foods Herbs. Spices. Seasoning mixes without salt. Unsalted popcorn and pretzels. Fat-free sweets. What foods are not recommended? The items listed may not be a complete list. Talk with your dietitian about what dietary choices are best for you. Grains Baked goods made with fat, such as croissants, muffins, or some breads. Dry pasta or rice meal packs. Vegetables Creamed or fried vegetables. Vegetables in a cheese sauce. Regular canned vegetables (not low-sodium or reduced-sodium). Regular canned tomato sauce and paste (not low-sodium or reduced-sodium). Regular tomato and vegetable juice (not low-sodium or reduced-sodium). Pickles. Olives. Fruits Canned fruit in a light or heavy syrup. Fried fruit. Fruit in cream or butter sauce. Meat and other protein foods Fatty cuts of meat. Ribs. Fried meat. Bacon. Sausage. Bologna and other processed lunch meats. Salami. Fatback. Hotdogs. Bratwurst. Salted nuts and seeds. Canned beans with added salt. Canned or smoked fish. Whole eggs or egg yolks. Chicken or turkey with skin. Dairy Whole or 2% milk, cream, and half-and-half. Whole or full-fat cream cheese. Whole-fat or sweetened yogurt. Full-fat cheese. Nondairy creamers. Whipped toppings.  Processed cheese and cheese spreads. Fats and oils Butter. Stick margarine. Lard. Shortening. Ghee. Bacon fat. Tropical oils, such as coconut, palm kernel, or palm oil. Seasoning and other foods Salted popcorn and pretzels. Onion salt, garlic salt, seasoned salt, table salt, and sea salt. Worcestershire sauce. Tartar sauce. Barbecue sauce. Teriyaki sauce. Soy sauce, including reduced-sodium. Steak sauce. Canned and packaged gravies. Fish sauce. Oyster sauce. Cocktail sauce. Horseradish that you find on the shelf. Ketchup. Mustard. Meat flavorings and tenderizers. Bouillon cubes. Hot sauce and Tabasco sauce. Premade or packaged marinades. Premade or packaged taco seasonings. Relishes. Regular salad dressings. Where to find more information:  National Heart, Lung, and Blood Institute: www.nhlbi.nih.gov  American Heart Association: www.heart.org Summary  The DASH eating plan is a healthy eating plan that has been shown to reduce high blood pressure (hypertension). It may also reduce your risk for type 2 diabetes, heart disease, and stroke.  With the DASH eating plan, you should limit salt (sodium) intake to 2,300 mg a day. If you have hypertension, you may need to reduce your sodium intake to 1,500 mg a day.  When on the DASH eating plan, aim to eat more fresh fruits and vegetables, whole grains, lean proteins, low-fat dairy, and heart-healthy fats.  Work with your health care provider or diet and nutrition specialist (dietitian) to adjust your eating plan to your individual   calorie needs. This information is not intended to replace advice given to you by your health care provider. Make sure you discuss any questions you have with your health care provider. Document Released: 01/10/2011 Document Revised: 01/15/2016 Document Reviewed: 01/15/2016 Elsevier Interactive Patient Education  2017 Elsevier Inc.  

## 2016-05-08 ENCOUNTER — Other Ambulatory Visit: Payer: 59

## 2016-05-08 ENCOUNTER — Ambulatory Visit (INDEPENDENT_AMBULATORY_CARE_PROVIDER_SITE_OTHER): Payer: 59 | Admitting: Internal Medicine

## 2016-05-08 ENCOUNTER — Encounter: Payer: Self-pay | Admitting: Internal Medicine

## 2016-05-08 ENCOUNTER — Telehealth: Payer: Self-pay | Admitting: Family Medicine

## 2016-05-08 VITALS — BP 134/78 | HR 69 | Temp 98.3°F | Ht 69.0 in | Wt 198.0 lb

## 2016-05-08 DIAGNOSIS — L02411 Cutaneous abscess of right axilla: Secondary | ICD-10-CM

## 2016-05-08 MED ORDER — RIFAMPIN 300 MG PO CAPS: 300.0000 mg | ORAL_CAPSULE | Freq: Two times a day (BID) | ORAL | 0 refills | Status: DC

## 2016-05-08 MED ORDER — PROMETHAZINE HCL 12.5 MG PO TABS: 12.5000 mg | ORAL_TABLET | Freq: Four times a day (QID) | ORAL | 0 refills | Status: DC | PRN

## 2016-05-08 MED ORDER — SULFAMETHOXAZOLE-TRIMETHOPRIM 800-160 MG PO TABS
1.0000 | ORAL_TABLET | Freq: Two times a day (BID) | ORAL | 0 refills | Status: DC
Start: 2016-05-08 — End: 2016-05-13

## 2016-05-08 MED ORDER — OXYCODONE-ACETAMINOPHEN 7.5-325 MG PO TABS: 1.0000 | ORAL_TABLET | ORAL | 0 refills | Status: DC | PRN

## 2016-05-08 NOTE — Progress Notes (Signed)
Subjective:  Patient ID: Jason Bradley, male    DOB: [birth year]  Age: 56 y.o. MRN: [id number]  CC: Abscess  NEW TO ME  HPI Jason Bradley presents for concerns about a 5 day history of redness, pain, swelling in his right armpit that spreads into his right proximal arm. He is also had a few episodes of low-grade fever and chills.  Outpatient Medications Prior to Visit  Medication Sig Dispense Refill  . Flaxseed, Linseed, (FLAX SEED OIL) 1000 MG CAPS Take 1 capsule by mouth daily.    . Multiple Vitamins-Minerals (MULTIVITAMIN MEN PO) Take 1 tablet by mouth daily.    . Omega-3 Fatty Acids (FISH OIL) 1000 MG CAPS Take 1 capsule by mouth daily.    . Red Yeast Rice 600 MG TABS Take 1 tablet by mouth daily.    . timolol (TIMOPTIC) 0.5 % ophthalmic solution Place 1 drop into both eyes daily.     No facility-administered medications prior to visit.     ROS Review of Systems  Constitutional: Positive for chills and fever. Negative for activity change, appetite change, diaphoresis, fatigue and unexpected weight change.  HENT: Negative.  Negative for sore throat.   Eyes: Negative.   Respiratory: Negative for cough, chest tightness and shortness of breath.   Cardiovascular: Negative for chest pain, palpitations and leg swelling.  Gastrointestinal: Negative for abdominal pain, diarrhea, nausea and vomiting.  Endocrine: Negative.   Genitourinary: Negative.   Musculoskeletal: Negative.   Skin: Positive for color change.  Neurological: Negative.   Hematological: Negative for adenopathy. Does not bruise/bleed easily.  Psychiatric/Behavioral: Negative.     Objective:  BP 134/78 (BP Location: Left Arm, Patient Position: Sitting, Cuff Size: Large)   Pulse 69   Temp 98.3 F (36.8 C) (Oral)   Ht 5\' 9"  (1.753 m)   Wt 198 lb (89.8 kg)   SpO2 98%   BMI 29.24 kg/m   BP Readings from Last 3 Encounters:  05/08/16 134/78  04/23/16 138/86  12/09/08 120/82    Wt Readings from Last 3 Encounters:    05/08/16 198 lb (89.8 kg)  04/23/16 215 lb (97.5 kg)  12/09/08 215 lb (97.5 kg)    Physical Exam  Constitutional:  Non-toxic appearance. He does not have a sickly appearance. He does not appear ill. No distress.  HENT:  Mouth/Throat: Oropharynx is clear and moist. No oropharyngeal exudate.  Eyes: Conjunctivae are normal. Right eye exhibits no discharge. Left eye exhibits no discharge. No scleral icterus.  Neck: Normal range of motion. Neck supple. No JVD present. No tracheal deviation present. No thyromegaly present.  Cardiovascular: Normal rate, regular rhythm, normal heart sounds and intact distal pulses.  Exam reveals no gallop.   No murmur heard. Pulmonary/Chest: Effort normal and breath sounds normal. No stridor. No respiratory distress. He has no decreased breath sounds. He has no wheezes. He has no rhonchi. He has no rales. He exhibits no tenderness.    The right axilla was cleaned with Betadine and prepped and draped in sterile fashion. Local anesthesia was obtained with instillation of 3 cc's of 2% lidocaine with epi. Next a 6 mm punch incision was made in the center of the area and a deep cavity filled with thick pus was identified. Several deep loculations that extend towards the right upper arm and right upper chest were identified. Some of these tract 3 cm in length. They were all identified, disrupted, they were irrigated with hydrogen peroxide. The cavity and tracks were packed with iodoform.  He tolerated this well. There was no blood loss and a dressing was applied.  Abdominal: Soft. Bowel sounds are normal. He exhibits no distension and no mass. There is no tenderness. There is no rebound and no guarding.  Musculoskeletal: Normal range of motion. He exhibits no edema, tenderness or deformity.  Lymphadenopathy:    He has no cervical adenopathy.  Skin: Skin is warm and dry. No rash noted. He is not diaphoretic. There is erythema. No pallor.  Vitals reviewed.   Lab Results   Component Value Date   WBC 3.5 (L) 04/23/2016   HGB 15.8 04/23/2016   HCT 47.5 04/23/2016   PLT 254.0 04/23/2016   GLUCOSE 90 04/23/2016   CHOL 241 (H) 04/23/2016   TRIG 88.0 04/23/2016   HDL 56.80 04/23/2016   LDLDIRECT 176.3 02/02/2008   LDLCALC 167 (H) 04/23/2016   ALT 23 04/23/2016   AST 19 04/23/2016   NA 139 04/23/2016   K 4.1 04/23/2016   CL 105 04/23/2016   CREATININE 0.95 04/23/2016   BUN 14 04/23/2016   CO2 29 04/23/2016   PSA 1.29 07/21/2007    No results found.  Assessment & Plan:   Jason Bradley was seen today for abscess.  Diagnoses and all orders for this visit:  Abscess of axilla, right- Incision and drainage completed, culture has been sent, the abscess cavity has been packed, this is very suspicious for MRSA so will start double antibiotic therapy with Bactrim and rifampin, he will use oxycodone and Phenergan as needed for pain and to treat any nausea and vomiting that may develop from the medications. He agrees to return in 1-2 days for me to recheck the abscess and to remove the packing. -     sulfamethoxazole-trimethoprim (BACTRIM DS,SEPTRA DS) 800-160 MG tablet; Take 1 tablet by mouth 2 (two) times daily. -     rifampin (RIFADIN) 300 MG capsule; Take 1 capsule (300 mg total) by mouth 2 (two) times daily. -     promethazine (PHENERGAN) 12.5 MG tablet; Take 1 tablet (12.5 mg total) by mouth every 6 (six) hours as needed for nausea or vomiting. -     oxyCODONE-acetaminophen (PERCOCET) 7.5-325 MG tablet; Take 1 tablet by mouth every 4 (four) hours as needed. -     WOUND CULTURE; Future   I am having Jason Bradley start on sulfamethoxazole-trimethoprim, rifampin, promethazine, and oxyCODONE-acetaminophen. I am also having him maintain his timolol, Red Yeast Rice, Flax Seed Oil, Fish Oil, and Multiple Vitamins-Minerals (MULTIVITAMIN MEN PO).  Meds ordered this encounter  Medications  . sulfamethoxazole-trimethoprim (BACTRIM DS,SEPTRA DS) 800-160 MG tablet    Sig: Take 1  tablet by mouth 2 (two) times daily.    Dispense:  14 tablet    Refill:  0  . rifampin (RIFADIN) 300 MG capsule    Sig: Take 1 capsule (300 mg total) by mouth 2 (two) times daily.    Dispense:  14 capsule    Refill:  0  . promethazine (PHENERGAN) 12.5 MG tablet    Sig: Take 1 tablet (12.5 mg total) by mouth every 6 (six) hours as needed for nausea or vomiting.    Dispense:  30 tablet    Refill:  0  . oxyCODONE-acetaminophen (PERCOCET) 7.5-325 MG tablet    Sig: Take 1 tablet by mouth every 4 (four) hours as needed.    Dispense:  20 tablet    Refill:  0     Follow-up: Return in about 2 days (around 05/10/2016).  [name],  MD

## 2016-05-08 NOTE — Patient Instructions (Signed)
Incision and Drainage Incision and drainage is a surgical procedure to open and drain a fluid-filled sac. The sac may be filled with pus, mucus, or blood. Examples of fluid-filled sacs that may need surgical drainage include cysts, skin infections (abscesses), and red lumps that develop from a ruptured cyst or a small abscess (boils). You may need this procedure if the affected area is large, painful, infected, or not healing well. Tell a health care provider about:  Any allergies you have.  All medicines you are taking, including vitamins, herbs, eye drops, creams, and over-the-counter medicines.  Any problems you or family members have had with anesthetic medicines.  Any blood disorders you have.  Any surgeries you have had.  Any medical conditions you have.  Whether you are pregnant or may be pregnant. What are the risks? Generally, this is a safe procedure. However, problems may occur, including:  Infection.  Bleeding.  Allergic reactions to medicines.  Scarring.  What happens before the procedure?  You may need an ultrasound or other imaging tests to see how large or deep the fluid-filled sac is.  You may have blood tests to check for infection.  You may get a tetanus shot.  You may be given antibiotic medicine to help prevent infection.  Follow instructions from your health care provider about eating or drinking restrictions.  Ask your health care provider about: ? Changing or stopping your regular medicines. This is especially important if you are taking diabetes medicines or blood thinners. ? Taking medicines such as aspirin and ibuprofen. These medicines can thin your blood. Do not take these medicines before your procedure if your health care provider instructs you not to.  Plan to have someone take you home after the procedure.  If you will be going home right after the procedure, plan to have someone stay with you for 24 hours. What happens during the  procedure?  To reduce your risk of infection: ? Your health care team will wash or sanitize their hands. ? Your skin will be washed with soap.  You will be given one or more of the following: ? A medicine to help you relax (sedative). ? A medicine to numb the area (local anesthetic). ? A medicine to make you fall asleep (general anesthetic).  An incision will be made in the top of the fluid-filled sac.  The contents of the sac may be squeezed out, or a syringe or tube (catheter)may be used to empty the sac.  The catheter may be left in place for several weeks to drain any fluid. Or, your health care provider may stitch open the edges of the incision to make a long-term opening for drainage (marsupialization).  The inside of the sac may be washed out (irrigated) with a sterile solution and packed with gauze before it is covered with a bandage (dressing). The procedure may vary among health care providers and hospitals. What happens after the procedure?  Your blood pressure, heart rate, breathing rate, and blood oxygen level will be monitored often until the medicines you were given have worn off.  Do not drive for 24 hours if you received a sedative. This information is not intended to replace advice given to you by your health care provider. Make sure you discuss any questions you have with your health care provider. Document Released: 07/17/2000 Document Revised: 06/29/2015 Document Reviewed: 11/11/2014 Elsevier Interactive Patient Education  2017 [employer]  

## 2016-05-08 NOTE — Progress Notes (Signed)
Pre visit review using our clinic review tool, if applicable. No additional management support is needed unless otherwise documented below in the visit note. 

## 2016-05-08 NOTE — Telephone Encounter (Signed)
Pt has appt with Dr [name] today at 1:45.

## 2016-05-08 NOTE — Telephone Encounter (Signed)
Patient Name: Jason REIERSON DOB: 22-Oct-1960 Initial Comment -Caller states has a cyst under his arm pit. Red, irritated and swollen. Had a fever yesterday, but no fever today. Gave Tylenol. Have been applying warm compresses and boil cream for the pain. Nurse Assessment Nurse: Ronnald Ramp, RN, Miranda Date/Time (Eastern Time): 05/08/2016 9:50:34 AM Confirm and document reason for call. If symptomatic, describe symptoms. ---Spoke with pt. States he has a red cyst under his right arm pit. He first noticed it 2 days ago and getting worse. He feels he has a fever but has not checked his temp. Does the patient have any new or worsening symptoms? ---Yes Will a triage be completed? ---Yes Related visit to physician within the last 2 weeks? ---No Does the PT have any chronic conditions? (i.e. diabetes, asthma, etc.) ---No Is this a behavioral health or substance abuse call? ---No Guidelines Guideline Title Affirmed Question Affirmed Notes Skin Lump or Localized Swelling [1] Swelling is painful to touch AND [2] fever Final Disposition User See Physician within 4 Hours (or PCP triage) Ronnald Ramp, RN, Miranda Comments No appt available at primary office. Appt scheduled for 1:45pm with Dr. Ronnald Ramp and [facility] office. Referrals GO TO FACILITY OTHER - SPECIFY Disagree/Comply: Comply

## 2016-05-10 ENCOUNTER — Ambulatory Visit (INDEPENDENT_AMBULATORY_CARE_PROVIDER_SITE_OTHER): Payer: 59 | Admitting: Internal Medicine

## 2016-05-10 VITALS — BP 130/70 | Temp 97.8°F | Resp 16

## 2016-05-10 DIAGNOSIS — L02411 Cutaneous abscess of right axilla: Secondary | ICD-10-CM

## 2016-05-10 NOTE — Patient Instructions (Signed)
Incision and Drainage Incision and drainage is a surgical procedure to open and drain a fluid-filled sac. The sac may be filled with pus, mucus, or blood. Examples of fluid-filled sacs that may need surgical drainage include cysts, skin infections (abscesses), and red lumps that develop from a ruptured cyst or a small abscess (boils). You may need this procedure if the affected area is large, painful, infected, or not healing well. Tell a health care provider about:  Any allergies you have.  All medicines you are taking, including vitamins, herbs, eye drops, creams, and over-the-counter medicines.  Any problems you or family members have had with anesthetic medicines.  Any blood disorders you have.  Any surgeries you have had.  Any medical conditions you have.  Whether you are pregnant or may be pregnant. What are the risks? Generally, this is a safe procedure. However, problems may occur, including:  Infection.  Bleeding.  Allergic reactions to medicines.  Scarring.  What happens before the procedure?  You may need an ultrasound or other imaging tests to see how large or deep the fluid-filled sac is.  You may have blood tests to check for infection.  You may get a tetanus shot.  You may be given antibiotic medicine to help prevent infection.  Follow instructions from your health care provider about eating or drinking restrictions.  Ask your health care provider about: ? Changing or stopping your regular medicines. This is especially important if you are taking diabetes medicines or blood thinners. ? Taking medicines such as aspirin and ibuprofen. These medicines can thin your blood. Do not take these medicines before your procedure if your health care provider instructs you not to.  Plan to have someone take you home after the procedure.  If you will be going home right after the procedure, plan to have someone stay with you for 24 hours. What happens during the  procedure?  To reduce your risk of infection: ? Your health care team will wash or sanitize their hands. ? Your skin will be washed with soap.  You will be given one or more of the following: ? A medicine to help you relax (sedative). ? A medicine to numb the area (local anesthetic). ? A medicine to make you fall asleep (general anesthetic).  An incision will be made in the top of the fluid-filled sac.  The contents of the sac may be squeezed out, or a syringe or tube (catheter)may be used to empty the sac.  The catheter may be left in place for several weeks to drain any fluid. Or, your health care provider may stitch open the edges of the incision to make a long-term opening for drainage (marsupialization).  The inside of the sac may be washed out (irrigated) with a sterile solution and packed with gauze before it is covered with a bandage (dressing). The procedure may vary among health care providers and hospitals. What happens after the procedure?  Your blood pressure, heart rate, breathing rate, and blood oxygen level will be monitored often until the medicines you were given have worn off.  Do not drive for 24 hours if you received a sedative. This information is not intended to replace advice given to you by your health care provider. Make sure you discuss any questions you have with your health care provider. Document Released: 07/17/2000 Document Revised: 06/29/2015 Document Reviewed: 11/11/2014 Elsevier Interactive Patient Education  2017 [employer]  

## 2016-05-10 NOTE — Progress Notes (Signed)
Subjective:  Patient ID: Jason Bradley, male    DOB: 10-17-1960  Age: 56 y.o. MRN: [id number]  CC: Abscess   HPI Jason Bradley presents for f/up - He is 2 days status post incision, drainage, and packing of an abscess in his right axilla. The culture is returning positive for MRSA. He started taking the Bactrim but his wife indicates that there has been a mixup at the pharmacy and he not has not started the rifampin yet. He tells me he feels better with less pain, redness, swelling in his proximal right upper extremity and right axilla. He's had no fever, chills, nausea, vomiting, diarrhea, or fatigue.  Outpatient Medications Prior to Visit  Medication Sig Dispense Refill  . Flaxseed, Linseed, (FLAX SEED OIL) 1000 MG CAPS Take 1 capsule by mouth daily.    . Multiple Vitamins-Minerals (MULTIVITAMIN MEN PO) Take 1 tablet by mouth daily.    . Omega-3 Fatty Acids (FISH OIL) 1000 MG CAPS Take 1 capsule by mouth daily.    Marland Kitchen oxyCODONE-acetaminophen (PERCOCET) 7.5-325 MG tablet Take 1 tablet by mouth every 4 (four) hours as needed. 20 tablet 0  . promethazine (PHENERGAN) 12.5 MG tablet Take 1 tablet (12.5 mg total) by mouth every 6 (six) hours as needed for nausea or vomiting. 30 tablet 0  . Red Yeast Rice 600 MG TABS Take 1 tablet by mouth daily.    . rifampin (RIFADIN) 300 MG capsule Take 1 capsule (300 mg total) by mouth 2 (two) times daily. 14 capsule 0  . sulfamethoxazole-trimethoprim (BACTRIM DS,SEPTRA DS) 800-160 MG tablet Take 1 tablet by mouth 2 (two) times daily. 14 tablet 0  . timolol (TIMOPTIC) 0.5 % ophthalmic solution Place 1 drop into both eyes daily.     No facility-administered medications prior to visit.     ROS Review of Systems  Constitutional: Negative.  Negative for chills, fatigue and fever.  HENT: Negative.   Eyes: Negative.   Respiratory: Negative for shortness of breath.   Cardiovascular: Negative for leg swelling.  Gastrointestinal: Negative for abdominal pain.    Genitourinary: Negative.   Musculoskeletal: Negative.   Skin: Positive for wound.  Allergic/Immunologic: Negative.   Neurological: Negative.   Hematological: Negative.  Negative for adenopathy. Does not bruise/bleed easily.  Psychiatric/Behavioral: Negative.   All other systems reviewed and are negative.   Objective:  BP 130/70   Temp 97.8 F (36.6 C) (Oral)   Resp 16   SpO2 98%   BP Readings from Last 3 Encounters:  05/12/16 130/70  05/08/16 134/78  04/23/16 138/86    Wt Readings from Last 3 Encounters:  05/08/16 198 lb (89.8 kg)  04/23/16 215 lb (97.5 kg)  12/09/08 215 lb (97.5 kg)    Physical Exam  Constitutional:  Non-toxic appearance. He does not have a sickly appearance. He does not appear ill. No distress.  Pulmonary/Chest:    The site of incision and drainage in the right axilla was identified and the packing was removed. There was additional purulent exudate that was irrigated  with H2O2. Q-tips w H2O2 were used to explore the cavity and a few more loculations were noted superior and just distal to the abscess, these were disrupted and irrigated. The cavity was packed again with iodoform.    Lab Results  Component Value Date   WBC 3.5 (L) 04/23/2016   HGB 15.8 04/23/2016   HCT 47.5 04/23/2016   PLT 254.0 04/23/2016   GLUCOSE 90 04/23/2016   CHOL 241 (H) 04/23/2016  TRIG 88.0 04/23/2016   HDL 56.80 04/23/2016   LDLDIRECT 176.3 02/02/2008   LDLCALC 167 (H) 04/23/2016   ALT 23 04/23/2016   AST 19 04/23/2016   NA 139 04/23/2016   K 4.1 04/23/2016   CL 105 04/23/2016   CREATININE 0.95 04/23/2016   BUN 14 04/23/2016   CO2 29 04/23/2016   PSA 1.29 07/21/2007    No results found.  Assessment & Plan:   Jason Bradley was seen today for abscess.  Diagnoses and all orders for this visit:  Abscess of axilla, right- Some improvement was noted but there were a few more loculations noted today so the cavity was repacked with iodoform. His wife has contacted the  pharmacy and tells me the rifampin is available so he will start taking that in addition to the Bactrim. He will return in about 3 days for me to recheck the abscess and remove the packing. In the meantime he will let me know if there are any new or worsening symptoms.   I am having Jason Bradley maintain his timolol, Red Yeast Rice, Flax Seed Oil, Fish Oil, Multiple Vitamins-Minerals (MULTIVITAMIN MEN PO), sulfamethoxazole-trimethoprim, rifampin, promethazine, and oxyCODONE-acetaminophen.  No orders of the defined types were placed in this encounter.    Follow-up: Return in about 3 days (around 05/13/2016).  [name], MD

## 2016-05-11 LAB — WOUND CULTURE: Gram Stain: NONE SEEN

## 2016-05-12 ENCOUNTER — Encounter: Payer: Self-pay | Admitting: Internal Medicine

## 2016-05-13 ENCOUNTER — Telehealth: Payer: Self-pay | Admitting: Internal Medicine

## 2016-05-13 ENCOUNTER — Ambulatory Visit (INDEPENDENT_AMBULATORY_CARE_PROVIDER_SITE_OTHER)
Admission: RE | Admit: 2016-05-13 | Discharge: 2016-05-13 | Disposition: A | Discharge status: Home / Self Care | Payer: 59 | Source: Ambulatory Visit | Attending: Internal Medicine | Admitting: Internal Medicine

## 2016-05-13 ENCOUNTER — Other Ambulatory Visit (INDEPENDENT_AMBULATORY_CARE_PROVIDER_SITE_OTHER): Payer: 59

## 2016-05-13 ENCOUNTER — Encounter: Payer: Self-pay | Admitting: Internal Medicine

## 2016-05-13 ENCOUNTER — Ambulatory Visit (INDEPENDENT_AMBULATORY_CARE_PROVIDER_SITE_OTHER): Payer: 59 | Admitting: Internal Medicine

## 2016-05-13 VITALS — BP 118/64 | HR 89 | Resp 16 | Ht 69.0 in | Wt 212.8 lb

## 2016-05-13 DIAGNOSIS — L02411 Cutaneous abscess of right axilla: Secondary | ICD-10-CM

## 2016-05-13 DIAGNOSIS — Z882 Allergy status to sulfonamides status: Secondary | ICD-10-CM

## 2016-05-13 LAB — BASIC METABOLIC PANEL WITH GFR
BUN: 11 mg/dL (ref 6–23)
CO2: 26 meq/L (ref 19–32)
Calcium: 9.5 mg/dL (ref 8.4–10.5)
Chloride: 99 meq/L (ref 96–112)
Creatinine, Ser: 1.25 mg/dL (ref 0.40–1.50)
GFR: 76.3 mL/min
Glucose, Bld: 119 mg/dL — ABNORMAL HIGH (ref 70–99)
Potassium: 4.4 meq/L (ref 3.5–5.1)
Sodium: 133 meq/L — ABNORMAL LOW (ref 135–145)

## 2016-05-13 LAB — CBC WITH DIFFERENTIAL/PLATELET
Basophils Absolute: 0 10*3/uL (ref 0.0–0.1)
Basophils Relative: 0.2 % (ref 0.0–3.0)
Eosinophils Absolute: 0.1 10*3/uL (ref 0.0–0.7)
Eosinophils Relative: 0.5 % (ref 0.0–5.0)
HCT: 46.2 % (ref 39.0–52.0)
Hemoglobin: 15.6 g/dL (ref 13.0–17.0)
Lymphocytes Relative: 7.4 % — ABNORMAL LOW (ref 12.0–46.0)
Lymphs Abs: 1 10*3/uL (ref 0.7–4.0)
MCHC: 33.7 g/dL (ref 30.0–36.0)
MCV: 93.1 fl (ref 78.0–100.0)
Monocytes Absolute: 1.1 10*3/uL — ABNORMAL HIGH (ref 0.1–1.0)
Monocytes Relative: 8.7 % (ref 3.0–12.0)
Neutro Abs: 11 10*3/uL — ABNORMAL HIGH (ref 1.4–7.7)
Neutrophils Relative %: 83.2 % — ABNORMAL HIGH (ref 43.0–77.0)
Platelets: 383 10*3/uL (ref 150.0–400.0)
RBC: 4.97 Mil/uL (ref 4.22–5.81)
RDW: 13.1 % (ref 11.5–15.5)
WBC: 13.3 10*3/uL — ABNORMAL HIGH (ref 4.0–10.5)

## 2016-05-13 MED ORDER — IOPAMIDOL (ISOVUE-300) INJECTION 61%
100.0000 mL | Freq: Once | INTRAVENOUS | Status: AC | PRN
  Administered 2016-05-13: 80 mL via INTRAVENOUS

## 2016-05-13 MED ORDER — DOXYCYCLINE MONOHYDRATE 100 MG PO CAPS: 100.0000 mg | ORAL_CAPSULE | Freq: Two times a day (BID) | ORAL | 0 refills | Status: DC

## 2016-05-13 NOTE — Progress Notes (Signed)
Pre visit review using our clinic review tool, if applicable. No additional management support is needed unless otherwise documented below in the visit note. 

## 2016-05-13 NOTE — Telephone Encounter (Signed)
Dr Zigmund Daniel called to let Dr Ronnald Ramp know that the pts CT scan was abnormal and he will need to follow up with the pt regarding the abbesses that was found. If he has any questions, Dr Zigmund Daniel can be reached at [phone].  Thanks, [employer]

## 2016-05-13 NOTE — Progress Notes (Signed)
Subjective:  Patient ID: Jason Bradley, male    DOB: [birth year]  Age: 56 y.o. MRN: [id number]  CC: Abscess and Rash   HPI Linkyn Gobin presents for f/up - Since I last saw him he has developed a mildly pruritic rash covering his torso and extremities. He also tells me that he has developed a new area on his right proximal arm that has been peeling and there has been some redness and swelling. The abscess site continues to drain pus. He denies fever, chills, shortness of breath, cough, abdominal pain, or diarrhea.  Outpatient Medications Prior to Visit  Medication Sig Dispense Refill  . Flaxseed, Linseed, (FLAX SEED OIL) 1000 MG CAPS Take 1 capsule by mouth daily.    . Multiple Vitamins-Minerals (MULTIVITAMIN MEN PO) Take 1 tablet by mouth daily.    . Omega-3 Fatty Acids (FISH OIL) 1000 MG CAPS Take 1 capsule by mouth daily.    . promethazine (PHENERGAN) 12.5 MG tablet Take 1 tablet (12.5 mg total) by mouth every 6 (six) hours as needed for nausea or vomiting. 30 tablet 0  . Red Yeast Rice 600 MG TABS Take 1 tablet by mouth daily.    . rifampin (RIFADIN) 300 MG capsule Take 1 capsule (300 mg total) by mouth 2 (two) times daily. 14 capsule 0  . timolol (TIMOPTIC) 0.5 % ophthalmic solution Place 1 drop into both eyes daily.    Marland Kitchen sulfamethoxazole-trimethoprim (BACTRIM DS,SEPTRA DS) 800-160 MG tablet Take 1 tablet by mouth 2 (two) times daily. 14 tablet 0  . oxyCODONE-acetaminophen (PERCOCET) 7.5-325 MG tablet Take 1 tablet by mouth every 4 (four) hours as needed. (Patient not taking: Reported on 05/13/2016) 20 tablet 0   No facility-administered medications prior to visit.     ROS Review of Systems  Constitutional: Negative.  Negative for chills, diaphoresis, fever and unexpected weight change.  HENT: Negative.   Eyes: Negative.   Respiratory: Negative for cough, chest tightness, shortness of breath and wheezing.   Cardiovascular: Negative for chest pain, palpitations and leg swelling.    Gastrointestinal: Negative for abdominal pain, constipation, diarrhea, nausea and vomiting.  Endocrine: Negative.   Genitourinary: Negative.  Negative for difficulty urinating.  Musculoskeletal: Negative.  Negative for back pain and myalgias.  Skin: Positive for rash. Negative for color change and wound.  Allergic/Immunologic: Negative.   Neurological: Negative.  Negative for dizziness.  Hematological: Negative for adenopathy. Does not bruise/bleed easily.  Psychiatric/Behavioral: Negative.     Objective:  BP 118/64 (BP Location: Right Arm, Patient Position: Sitting, Cuff Size: Large)   Pulse 89   Resp 16   Ht 5\' 9"  (1.753 m)   Wt 212 lb 12 oz (96.5 kg)   SpO2 98%   BMI 31.42 kg/m   BP Readings from Last 3 Encounters:  05/13/16 118/64  05/12/16 130/70  05/08/16 134/78    Wt Readings from Last 3 Encounters:  05/13/16 212 lb 12 oz (96.5 kg)  05/08/16 198 lb (89.8 kg)  04/23/16 215 lb (97.5 kg)    Physical Exam  Constitutional: No distress.  HENT:  Mouth/Throat: Oropharynx is clear and moist. No oropharyngeal exudate.  Eyes: Conjunctivae are normal. Right eye exhibits no discharge. Left eye exhibits no discharge. No scleral icterus.  Neck: Normal range of motion. Neck supple. No JVD present. No tracheal deviation present. No thyromegaly present.  Cardiovascular: Normal rate, regular rhythm, normal heart sounds and intact distal pulses.  Exam reveals no gallop and no friction rub.   No murmur heard.  Pulmonary/Chest: Effort normal and breath sounds normal. No stridor. No respiratory distress. He has no wheezes. He has no rales. He exhibits no tenderness.  Abdominal: Soft. Bowel sounds are normal. He exhibits no distension and no mass. There is no tenderness. There is no rebound and no guarding.  Musculoskeletal: Normal range of motion. He exhibits no edema or deformity.       Right upper arm: He exhibits tenderness and swelling. He exhibits no bony tenderness, no edema, no  deformity and no laceration.       Arms: Over the proximal, right upper humerus, medially there is an area of peeling and a new area of induration, fluctuance, and warmth. The overall area of erythema has diminished some. This collection in the right upper humerus connects to the right axillary abscess and drains a significant amount of pus and blood thru the axillary abscess. This area was explored through the original incision and drainage site and was cleaned with H2O2 and packed with iodoform.  Lymphadenopathy:    He has no cervical adenopathy.  Skin: Skin is dry. Rash noted. No ecchymosis, no lesion and no petechiae noted. Rash is macular. Rash is not papular, not pustular, not vesicular and not urticarial. He is not diaphoretic. No erythema. No pallor.  Over his torso and upper extremities there is a faint, erythematous, blanching, macular rash. There are no vesicles, targets, or mucosal involvement.  Vitals reviewed.   Lab Results  Component Value Date   WBC 3.5 (L) 04/23/2016   HGB 15.8 04/23/2016   HCT 47.5 04/23/2016   PLT 254.0 04/23/2016   GLUCOSE 90 04/23/2016   CHOL 241 (H) 04/23/2016   TRIG 88.0 04/23/2016   HDL 56.80 04/23/2016   LDLDIRECT 176.3 02/02/2008   LDLCALC 167 (H) 04/23/2016   ALT 23 04/23/2016   AST 19 04/23/2016   NA 139 04/23/2016   K 4.1 04/23/2016   CL 105 04/23/2016   CREATININE 0.95 04/23/2016   BUN 14 04/23/2016   CO2 29 04/23/2016   PSA 1.29 07/21/2007    No results found.  Assessment & Plan:   Westly was seen today for abscess and rash.  Diagnoses and all orders for this visit:  Abscess of axilla, right- I've ordered a CT scan with contrast to see if there is additional abscess formation in the axilla or proximal humerus. If there is then he will have to be seen by surgery to undergo a more extensive incision and drainage, possibly under anesthesia. If the CT scan shows that all of the abscess has resolved and what remains is a cellulitis  then he will continue oral antibiotic therapy and will return in approximately 1-2 days for me to remove the current packing that's in place. He has developed a sulfa allergy so will stop Bactrim DS and change to doxycycline but I've asked him to continue the rifampin. -     CBC with Differential/Platelet; Future -     Basic metabolic panel; Future -     CT HUMERUS RIGHT W CONTRAST; Future -     doxycycline (MONODOX) 100 MG capsule; Take 1 capsule (100 mg total) by mouth 2 (two) times daily.  Allergy to sulfa drugs- will discontinue Bactrim, he has very minimal symptoms so unless the rash worsens or if he develop symptoms will not treat with systemic steroids or antihistamines at this time.   I have discontinued Mr. Bink sulfamethoxazole-trimethoprim. I am also having him start on doxycycline. Additionally, I am having him maintain  his timolol, Red Yeast Rice, Flax Seed Oil, Fish Oil, Multiple Vitamins-Minerals (MULTIVITAMIN MEN PO), rifampin, promethazine, and oxyCODONE-acetaminophen.  Meds ordered this encounter  Medications  . doxycycline (MONODOX) 100 MG capsule    Sig: Take 1 capsule (100 mg total) by mouth 2 (two) times daily.    Dispense:  20 capsule    Refill:  0     Follow-up: No Follow-up on file.  [name], MD

## 2016-05-13 NOTE — Patient Instructions (Signed)
Incision and Drainage Incision and drainage is a surgical procedure to open and drain a fluid-filled sac. The sac may be filled with pus, mucus, or blood. Examples of fluid-filled sacs that may need surgical drainage include cysts, skin infections (abscesses), and red lumps that develop from a ruptured cyst or a small abscess (boils). You may need this procedure if the affected area is large, painful, infected, or not healing well. Tell a health care provider about:  Any allergies you have.  All medicines you are taking, including vitamins, herbs, eye drops, creams, and over-the-counter medicines.  Any problems you or family members have had with anesthetic medicines.  Any blood disorders you have.  Any surgeries you have had.  Any medical conditions you have.  Whether you are pregnant or may be pregnant. What are the risks? Generally, this is a safe procedure. However, problems may occur, including:  Infection.  Bleeding.  Allergic reactions to medicines.  Scarring.  What happens before the procedure?  You may need an ultrasound or other imaging tests to see how large or deep the fluid-filled sac is.  You may have blood tests to check for infection.  You may get a tetanus shot.  You may be given antibiotic medicine to help prevent infection.  Follow instructions from your health care provider about eating or drinking restrictions.  Ask your health care provider about: ? Changing or stopping your regular medicines. This is especially important if you are taking diabetes medicines or blood thinners. ? Taking medicines such as aspirin and ibuprofen. These medicines can thin your blood. Do not take these medicines before your procedure if your health care provider instructs you not to.  Plan to have someone take you home after the procedure.  If you will be going home right after the procedure, plan to have someone stay with you for 24 hours. What happens during the  procedure?  To reduce your risk of infection: ? Your health care team will wash or sanitize their hands. ? Your skin will be washed with soap.  You will be given one or more of the following: ? A medicine to help you relax (sedative). ? A medicine to numb the area (local anesthetic). ? A medicine to make you fall asleep (general anesthetic).  An incision will be made in the top of the fluid-filled sac.  The contents of the sac may be squeezed out, or a syringe or tube (catheter)may be used to empty the sac.  The catheter may be left in place for several weeks to drain any fluid. Or, your health care provider may stitch open the edges of the incision to make a long-term opening for drainage (marsupialization).  The inside of the sac may be washed out (irrigated) with a sterile solution and packed with gauze before it is covered with a bandage (dressing). The procedure may vary among health care providers and hospitals. What happens after the procedure?  Your blood pressure, heart rate, breathing rate, and blood oxygen level will be monitored often until the medicines you were given have worn off.  Do not drive for 24 hours if you received a sedative. This information is not intended to replace advice given to you by your health care provider. Make sure you discuss any questions you have with your health care provider. Document Released: 07/17/2000 Document Revised: 06/29/2015 Document Reviewed: 11/11/2014 Elsevier Interactive Patient Education  2017 [employer]  

## 2016-05-14 NOTE — Telephone Encounter (Signed)
I spoke to the patient this morning, he tells me the area is feeling much better after he underwent an additional incision and drainage of the abscess with packing yesterday. He is coming in tomorrow for me to remove the packing and recheck the abscess.

## 2016-05-15 ENCOUNTER — Ambulatory Visit (INDEPENDENT_AMBULATORY_CARE_PROVIDER_SITE_OTHER): Payer: 59 | Admitting: Internal Medicine

## 2016-05-15 ENCOUNTER — Encounter: Payer: Self-pay | Admitting: Internal Medicine

## 2016-05-15 DIAGNOSIS — L02411 Cutaneous abscess of right axilla: Secondary | ICD-10-CM | POA: Diagnosis not present

## 2016-05-15 MED ORDER — DOXYCYCLINE MONOHYDRATE 100 MG PO CAPS: 100.0000 mg | ORAL_CAPSULE | Freq: Two times a day (BID) | ORAL | 0 refills | Status: AC

## 2016-05-15 MED ORDER — RIFAMPIN 300 MG PO CAPS
300.0000 mg | ORAL_CAPSULE | Freq: Two times a day (BID) | ORAL | 0 refills | Status: AC
Start: 2016-05-15 — End: 2016-05-22

## 2016-05-15 NOTE — Patient Instructions (Signed)
Incision and Drainage Incision and drainage is a surgical procedure to open and drain a fluid-filled sac. The sac may be filled with pus, mucus, or blood. Examples of fluid-filled sacs that may need surgical drainage include cysts, skin infections (abscesses), and red lumps that develop from a ruptured cyst or a small abscess (boils). You may need this procedure if the affected area is large, painful, infected, or not healing well. Tell a health care provider about:  Any allergies you have.  All medicines you are taking, including vitamins, herbs, eye drops, creams, and over-the-counter medicines.  Any problems you or family members have had with anesthetic medicines.  Any blood disorders you have.  Any surgeries you have had.  Any medical conditions you have.  Whether you are pregnant or may be pregnant. What are the risks? Generally, this is a safe procedure. However, problems may occur, including:  Infection.  Bleeding.  Allergic reactions to medicines.  Scarring.  What happens before the procedure?  You may need an ultrasound or other imaging tests to see how large or deep the fluid-filled sac is.  You may have blood tests to check for infection.  You may get a tetanus shot.  You may be given antibiotic medicine to help prevent infection.  Follow instructions from your health care provider about eating or drinking restrictions.  Ask your health care provider about: ? Changing or stopping your regular medicines. This is especially important if you are taking diabetes medicines or blood thinners. ? Taking medicines such as aspirin and ibuprofen. These medicines can thin your blood. Do not take these medicines before your procedure if your health care provider instructs you not to.  Plan to have someone take you home after the procedure.  If you will be going home right after the procedure, plan to have someone stay with you for 24 hours. What happens during the  procedure?  To reduce your risk of infection: ? Your health care team will wash or sanitize their hands. ? Your skin will be washed with soap.  You will be given one or more of the following: ? A medicine to help you relax (sedative). ? A medicine to numb the area (local anesthetic). ? A medicine to make you fall asleep (general anesthetic).  An incision will be made in the top of the fluid-filled sac.  The contents of the sac may be squeezed out, or a syringe or tube (catheter)may be used to empty the sac.  The catheter may be left in place for several weeks to drain any fluid. Or, your health care provider may stitch open the edges of the incision to make a long-term opening for drainage (marsupialization).  The inside of the sac may be washed out (irrigated) with a sterile solution and packed with gauze before it is covered with a bandage (dressing). The procedure may vary among health care providers and hospitals. What happens after the procedure?  Your blood pressure, heart rate, breathing rate, and blood oxygen level will be monitored often until the medicines you were given have worn off.  Do not drive for 24 hours if you received a sedative. This information is not intended to replace advice given to you by your health care provider. Make sure you discuss any questions you have with your health care provider. Document Released: 07/17/2000 Document Revised: 06/29/2015 Document Reviewed: 11/11/2014 Elsevier Interactive Patient Education  2017 [employer]  

## 2016-05-15 NOTE — Progress Notes (Signed)
Pre visit review using our clinic review tool, if applicable. No additional management support is needed unless otherwise documented below in the visit note. 

## 2016-05-15 NOTE — Progress Notes (Signed)
Subjective:  Patient ID: Jason Bradley, male    DOB: [birth year]  Age: 55 y.o. MRN: [id number]  CC: Abscess   HPI Jason Bradley presents for a recheck of abscess, When I last saw him he had accumulated an area of pain, redness, swelling in his right upper arm and a CT scan confirm that there was an abscess there but there is no evidence of invasion in the muscle. I again packed the abscess in the axilla and today he returns for recheck. The area of concern in the right upper arm is somewhat better but there is still an area of concern. The rash has resolved.  Outpatient Medications Prior to Visit  Medication Sig Dispense Refill  . Flaxseed, Linseed, (FLAX SEED OIL) 1000 MG CAPS Take 1 capsule by mouth daily.    . Multiple Vitamins-Minerals (MULTIVITAMIN MEN PO) Take 1 tablet by mouth daily.    . Omega-3 Fatty Acids (FISH OIL) 1000 MG CAPS Take 1 capsule by mouth daily.    . promethazine (PHENERGAN) 12.5 MG tablet Take 1 tablet (12.5 mg total) by mouth every 6 (six) hours as needed for nausea or vomiting. 30 tablet 0  . Red Yeast Rice 600 MG TABS Take 1 tablet by mouth daily.    . timolol (TIMOPTIC) 0.5 % ophthalmic solution Place 1 drop into both eyes daily.    Marland Kitchen doxycycline (MONODOX) 100 MG capsule Take 1 capsule (100 mg total) by mouth 2 (two) times daily. 20 capsule 0  . rifampin (RIFADIN) 300 MG capsule Take 1 capsule (300 mg total) by mouth 2 (two) times daily. 14 capsule 0  . oxyCODONE-acetaminophen (PERCOCET) 7.5-325 MG tablet Take 1 tablet by mouth every 4 (four) hours as needed. (Patient not taking: Reported on 05/15/2016) 20 tablet 0   No facility-administered medications prior to visit.     ROS Review of Systems  Constitutional: Negative for chills, fatigue and fever.  HENT: Negative.   Eyes: Negative for visual disturbance.  Respiratory: Negative for chest tightness.   Cardiovascular: Negative for chest pain and leg swelling.  Gastrointestinal: Negative for abdominal pain.    Endocrine: Negative.   Genitourinary: Negative.   Skin: Positive for color change and wound. Negative for pallor and rash.  Allergic/Immunologic: Negative.   Neurological: Negative.   Hematological: Negative for adenopathy. Does not bruise/bleed easily.    Objective:  BP 140/78 (BP Location: Left Arm, Patient Position: Sitting, Cuff Size: Large)   Pulse 69   Temp 97.8 F (36.6 C) (Oral)   Resp 16   Ht 5\' 8"  (1.727 m)   Wt 211 lb (95.7 kg)   SpO2 98%   BMI 32.08 kg/m   BP Readings from Last 3 Encounters:  05/15/16 140/78  05/13/16 118/64  05/12/16 130/70    Wt Readings from Last 3 Encounters:  05/15/16 211 lb (95.7 kg)  05/13/16 212 lb 12 oz (96.5 kg)  05/08/16 198 lb (89.8 kg)    Physical Exam  Constitutional:  Non-toxic appearance. He does not have a sickly appearance. He does not appear ill. No distress.  Musculoskeletal:       Arms: Right upper humerus was cleaned with Betadine and then the area was prepped and draped in sterile fashion. Local anesthesia was obtained with the instillation of 2% lidocaine with epi, 4 mL were used. A 6 mm punch incision was made in the center of the affected area. A copious amount of blood and some purulent exudate was evacuated. A Q-tip with H2O2 was  used to explore the area and several deep loculations were disrupted, irrigated, and drained. The cavity was packed with a copious amount of iodoform.  Skin: No rash noted. Rash is not macular, not papular, not vesicular and not urticarial.    Lab Results  Component Value Date   WBC 13.3 (H) 05/13/2016   HGB 15.6 05/13/2016   HCT 46.2 05/13/2016   PLT 383.0 05/13/2016   GLUCOSE 119 (H) 05/13/2016   CHOL 241 (H) 04/23/2016   TRIG 88.0 04/23/2016   HDL 56.80 04/23/2016   LDLDIRECT 176.3 02/02/2008   LDLCALC 167 (H) 04/23/2016   ALT 23 04/23/2016   AST 19 04/23/2016   NA 133 (L) 05/13/2016   K 4.4 05/13/2016   CL 99 05/13/2016   CREATININE 1.25 05/13/2016   BUN 11 05/13/2016    CO2 26 05/13/2016   PSA 1.29 07/21/2007    Ct Humerus Right W Contrast  Result Date: 05/13/2016 CLINICAL DATA:  5 day history of redness, swelling and pain in the right axilla and upper arm. EXAM: CT OF THE UPPER RIGHT EXTREMITY WITH CONTRAST TECHNIQUE: Multidetector CT imaging of the upper right extremity was performed according to the standard protocol following intravenous contrast administration. COMPARISON:  None.  Dr. maximum CONTRAST:  36mL ISOVUE-300 IOPAMIDOL (ISOVUE-300) INJECTION 61% at L back maximal mild FINDINGS: Bones/Joint/Cartilage Normal. Soft tissues The patient has had a subcutaneous abscess containing pus and gas in the subcutaneous fat of the anterior aspect of the right upper arm. The abscess extends into the anterior aspect of the left axilla. There is high-density material in the soft tissues suggesting that the patient has packing in wound. There is no extension of the infection into the underlying muscles at this time. Subcutaneous edema does extend posteriorly to the level of the elbow. IMPRESSION: Findings consistent with an aerobic gas-forming abscess and cellulitis in the subcutaneous fat of the right axilla and anterior aspect of the right upper arm superficial to the biceps muscle. This report was called to Dr. Ronnald Ramp ' office at 3:49 p.m. by Dr. Zigmund Daniel. Electronically Signed   By: Lorriane Shire M.D.   On: 05/13/2016 15:50    Assessment & Plan:   Jason Bradley was seen today for abscess.  Diagnoses and all orders for this visit:  Abscess of axilla, right- the area in the right axilla is healing with no additional signs of infection however the right upper humerus required another incision and drainage today. There is also concern for surrounding area of cellulitis so I've asked him to continue the doxycycline and rifampin for another 7 days. He will return in one day for me to remove the packing and to check his progress. -     doxycycline (MONODOX) 100 MG capsule; Take 1  capsule (100 mg total) by mouth 2 (two) times daily. -     rifampin (RIFADIN) 300 MG capsule; Take 1 capsule (300 mg total) by mouth 2 (two) times daily.   I am having Jason Bradley maintain his timolol, Red Yeast Rice, Flax Seed Oil, Fish Oil, Multiple Vitamins-Minerals (MULTIVITAMIN MEN PO), promethazine, oxyCODONE-acetaminophen, doxycycline, and rifampin.  Meds ordered this encounter  Medications  . doxycycline (MONODOX) 100 MG capsule    Sig: Take 1 capsule (100 mg total) by mouth 2 (two) times daily.    Dispense:  14 capsule    Refill:  0  . rifampin (RIFADIN) 300 MG capsule    Sig: Take 1 capsule (300 mg total) by mouth 2 (two) times daily.  Dispense:  14 capsule    Refill:  0     Follow-up: Return in about 1 day (around 05/16/2016).  [name], MD

## 2016-05-16 ENCOUNTER — Encounter: Payer: Self-pay | Admitting: Internal Medicine

## 2016-05-16 ENCOUNTER — Ambulatory Visit (INDEPENDENT_AMBULATORY_CARE_PROVIDER_SITE_OTHER): Payer: 59 | Admitting: Internal Medicine

## 2016-05-16 VITALS — BP 138/70 | HR 52 | Temp 97.9°F | Resp 16 | Ht 68.0 in | Wt 209.0 lb

## 2016-05-16 DIAGNOSIS — L02411 Cutaneous abscess of right axilla: Secondary | ICD-10-CM

## 2016-05-16 NOTE — Progress Notes (Signed)
Pre visit review using our clinic review tool, if applicable. No additional management support is needed unless otherwise documented below in the visit note. 

## 2016-05-16 NOTE — Patient Instructions (Signed)
Skin Abscess A skin abscess is an infected area on or under your skin that contains a collection of pus and other material. An abscess may also be called a furuncle, carbuncle, or boil. An abscess can occur in or on almost any part of your body. Some abscesses break open (rupture) on their own. Most continue to get worse unless they are treated. The infection can spread deeper into the body and eventually into your blood, which can make you feel ill. Treatment usually involves draining the abscess. What are the causes? An abscess occurs when germs, often bacteria, pass through your skin and cause an infection. This may be caused by:  A scrape or cut on your skin.  A puncture wound through your skin, including a needle injection.  Blocked oil or sweat glands.  Blocked and infected hair follicles.  A cyst that forms beneath your skin (sebaceous cyst) and becomes infected. What increases the risk? This condition is more likely to develop in people who:  Have a weak body defense system (immune system).  Have diabetes.  Have dry and irritated skin.  Get frequent injections or use illegal IV drugs.  Have a foreign body in a wound, such as a splinter.  Have problems with their lymph system or veins. What are the signs or symptoms? An abscess may start as a painful, firm bump under the skin. Over time, the abscess may get larger or become softer. Pus may appear at the top of the abscess, causing pressure and pain. It may eventually break through the skin and drain. Other symptoms include:  Redness.  Warmth.  Swelling.  Tenderness.  A sore on the skin. How is this diagnosed? This condition is diagnosed based on your medical history and a physical exam. A sample of pus may be taken from the abscess to find out what is causing the infection and what antibiotics can be used to treat it. You also may have:  Blood tests to look for signs of infection or spread of an infection to your  blood.  Imaging studies such as ultrasound, CT scan, or MRI if the abscess is deep. How is this treated? Small abscesses that drain on their own may not need treatment. Treatment for an abscess that does not rupture on its own may include:  Warm compresses applied to the area several times per day.  Incision and drainage. Your health care provider will make an incision to open the abscess and will remove pus and any foreign body or dead tissue. The incision area may be packed with gauze to keep it open for a few days while it heals.  Antibiotic medicines to treat infection. For a severe abscess, you may first get antibiotics through an IV and then change to oral antibiotics. Follow these instructions at home: Abscess Care   If you have an abscess that has not drained, place a warm, clean, wet washcloth over the abscess several times a day. Do this as told by your health care provider.  Follow instructions from your health care provider about how to take care of your abscess. Make sure you:  Cover the abscess with a bandage (dressing).  Change your dressing or gauze as told by your health care provider.  Wash your hands with soap and water before you change the dressing or gauze. If soap and water are not available, use hand sanitizer.  Check your abscess every day for signs of a worsening infection. Check for:  More redness, swelling, or   pain.  More fluid or blood.  Warmth.  More pus or a bad smell. Medicines   Take over-the-counter and prescription medicines only as told by your health care provider.  If you were prescribed an antibiotic medicine, take it as told by your health care provider. Do not stop taking the antibiotic even if you start to feel better. General instructions   To avoid spreading the infection:  Do not share personal care items, towels, or hot tubs with others.  Avoid making skin contact with other people.  Keep all follow-up visits as told by your  health care provider. This is important. Contact a health care provider if:  You have more redness, swelling, or pain around your abscess.  You have more fluid or blood coming from your abscess.  Your abscess feels warm to the touch.  You have more pus or a bad smell coming from your abscess.  You have a fever.  You have muscle aches.  You have chills or a general ill feeling. Get help right away if:  You have severe pain.  You see red streaks on your skin spreading away from the abscess. This information is not intended to replace advice given to you by your health care provider. Make sure you discuss any questions you have with your health care provider. Document Released: 10/31/2004 Document Revised: 09/17/2015 Document Reviewed: 11/30/2014 Elsevier Interactive Patient Education  2017 [employer] Inc[employer]  

## 2016-05-19 ENCOUNTER — Encounter: Payer: Self-pay | Admitting: Internal Medicine

## 2016-05-19 NOTE — Progress Notes (Signed)
Subjective:  Patient ID: Jason Bradley, male    DOB: 07/24/1960  Age: 56 y.o. MRN: [id number]  CC: Wound Check   HPI Zalen Sequeira presents for a recheck of his right upper extremity after an incision and drainage one day prior to this visit. He tells me the area feels much better with a remarkable decrease in pain, redness, swelling, and discharge.  Outpatient Medications Prior to Visit  Medication Sig Dispense Refill  . doxycycline (MONODOX) 100 MG capsule Take 1 capsule (100 mg total) by mouth 2 (two) times daily. 14 capsule 0  . Flaxseed, Linseed, (FLAX SEED OIL) 1000 MG CAPS Take 1 capsule by mouth daily.    . Multiple Vitamins-Minerals (MULTIVITAMIN MEN PO) Take 1 tablet by mouth daily.    . Omega-3 Fatty Acids (FISH OIL) 1000 MG CAPS Take 1 capsule by mouth daily.    Marland Kitchen oxyCODONE-acetaminophen (PERCOCET) 7.5-325 MG tablet Take 1 tablet by mouth every 4 (four) hours as needed. (Patient not taking: Reported on 05/15/2016) 20 tablet 0  . promethazine (PHENERGAN) 12.5 MG tablet Take 1 tablet (12.5 mg total) by mouth every 6 (six) hours as needed for nausea or vomiting. 30 tablet 0  . Red Yeast Rice 600 MG TABS Take 1 tablet by mouth daily.    . rifampin (RIFADIN) 300 MG capsule Take 1 capsule (300 mg total) by mouth 2 (two) times daily. 14 capsule 0  . timolol (TIMOPTIC) 0.5 % ophthalmic solution Place 1 drop into both eyes daily.     No facility-administered medications prior to visit.     ROS Review of Systems  Constitutional: Negative for chills, fatigue and fever.  HENT: Negative.   Respiratory: Negative.  Negative for cough and shortness of breath.   Cardiovascular: Negative.  Negative for chest pain, palpitations and leg swelling.  Gastrointestinal: Negative for abdominal pain.  Genitourinary: Negative.   Musculoskeletal: Negative.  Negative for back pain and myalgias.  Skin: Positive for wound. Negative for color change, pallor and rash.  Allergic/Immunologic: Negative.     Neurological: Negative.   Hematological: Negative for adenopathy. Does not bruise/bleed easily.  Psychiatric/Behavioral: Negative.     Objective:  BP 138/70 (BP Location: Left Arm, Patient Position: Sitting, Cuff Size: Large)   Pulse (!) 52   Temp 97.9 F (36.6 C) (Oral)   Resp 16   Ht 5\' 8"  (1.727 m)   Wt 209 lb (94.8 kg)   SpO2 99%   BMI 31.78 kg/m   BP Readings from Last 3 Encounters:  05/16/16 138/70  05/15/16 140/78  05/13/16 118/64    Wt Readings from Last 3 Encounters:  05/16/16 209 lb (94.8 kg)  05/15/16 211 lb (95.7 kg)  05/13/16 212 lb 12 oz (96.5 kg)    Physical Exam  Constitutional:  Non-toxic appearance. He does not have a sickly appearance. He does not appear ill. No distress.  Musculoskeletal:       Arms: The area in the right axilla shows some induration but the wound and cavity have healed with no additional induration, fluctuance, or exudate.    Lab Results  Component Value Date   WBC 13.3 (H) 05/13/2016   HGB 15.6 05/13/2016   HCT 46.2 05/13/2016   PLT 383.0 05/13/2016   GLUCOSE 119 (H) 05/13/2016   CHOL 241 (H) 04/23/2016   TRIG 88.0 04/23/2016   HDL 56.80 04/23/2016   LDLDIRECT 176.3 02/02/2008   LDLCALC 167 (H) 04/23/2016   ALT 23 04/23/2016   AST 19 04/23/2016  NA 133 (L) 05/13/2016   K 4.4 05/13/2016   CL 99 05/13/2016   CREATININE 1.25 05/13/2016   BUN 11 05/13/2016   CO2 26 05/13/2016   PSA 1.29 07/21/2007    Ct Humerus Right W Contrast  Result Date: 05/13/2016 CLINICAL DATA:  5 day history of redness, swelling and pain in the right axilla and upper arm. EXAM: CT OF THE UPPER RIGHT EXTREMITY WITH CONTRAST TECHNIQUE: Multidetector CT imaging of the upper right extremity was performed according to the standard protocol following intravenous contrast administration. COMPARISON:  None.  Dr. maximum CONTRAST:  64mL ISOVUE-300 IOPAMIDOL (ISOVUE-300) INJECTION 61% at L back maximal mild FINDINGS: Bones/Joint/Cartilage Normal. Soft  tissues The patient has had a subcutaneous abscess containing pus and gas in the subcutaneous fat of the anterior aspect of the right upper arm. The abscess extends into the anterior aspect of the left axilla. There is high-density material in the soft tissues suggesting that the patient has packing in wound. There is no extension of the infection into the underlying muscles at this time. Subcutaneous edema does extend posteriorly to the level of the elbow. IMPRESSION: Findings consistent with an aerobic gas-forming abscess and cellulitis in the subcutaneous fat of the right axilla and anterior aspect of the right upper arm superficial to the biceps muscle. This report was called to Dr. Ronnald Ramp ' office at 3:49 p.m. by Dr. Zigmund Daniel. Electronically Signed   By: Lorriane Shire M.D.   On: 05/13/2016 15:50    Assessment & Plan:   Jason Bradley was seen today for wound check.  Diagnoses and all orders for this visit:  Abscess of axilla, right- I don't think the cavity needs to be packed again, the infection appears to have resolved and the cavity is healing nicely. He will continue to complete course of antibiotics and will return in about 1 week for me to recheck the area, he will let me know sooner if he develops any new or worsening symptoms.   I am having Jason Bradley maintain his timolol, Red Yeast Rice, Flax Seed Oil, Fish Oil, Multiple Vitamins-Minerals (MULTIVITAMIN MEN PO), promethazine, oxyCODONE-acetaminophen, doxycycline, and rifampin.  No orders of the defined types were placed in this encounter.    Follow-up: Return in about 1 week (around 05/23/2016).  Jason Calico, MD

## 2017-04-15 ENCOUNTER — Ambulatory Visit: Payer: BLUE CROSS/BLUE SHIELD | Admitting: Internal Medicine

## 2017-04-15 ENCOUNTER — Encounter: Payer: Self-pay | Admitting: Internal Medicine

## 2017-04-15 VITALS — BP 122/84 | HR 78 | Temp 99.1°F | Wt 214.0 lb

## 2017-04-15 DIAGNOSIS — R059 Cough, unspecified: Secondary | ICD-10-CM

## 2017-04-15 DIAGNOSIS — R509 Fever, unspecified: Secondary | ICD-10-CM

## 2017-04-15 DIAGNOSIS — R0982 Postnasal drip: Secondary | ICD-10-CM

## 2017-04-15 DIAGNOSIS — R05 Cough: Secondary | ICD-10-CM | POA: Diagnosis not present

## 2017-04-15 LAB — POC URINALSYSI DIPSTICK (AUTOMATED)
Bilirubin, UA: NEGATIVE
Blood, UA: NEGATIVE
Glucose, UA: NEGATIVE
Ketones, UA: NEGATIVE
Leukocytes, UA: NEGATIVE
Nitrite, UA: NEGATIVE
Protein, UA: NEGATIVE
Spec Grav, UA: 1.015 (ref 1.010–1.025)
Urobilinogen, UA: 0.2 E.U./dL
pH, UA: 7.5 (ref 5.0–8.0)

## 2017-04-15 LAB — COMPREHENSIVE METABOLIC PANEL
ALT: 29 U/L (ref 0–53)
AST: 23 U/L (ref 0–37)
Albumin: 4 g/dL (ref 3.5–5.2)
Alkaline Phosphatase: 45 U/L (ref 39–117)
BUN: 9 mg/dL (ref 6–23)
CO2: 29 mEq/L (ref 19–32)
Calcium: 9.2 mg/dL (ref 8.4–10.5)
Chloride: 101 mEq/L (ref 96–112)
Creatinine, Ser: 1.07 mg/dL (ref 0.40–1.50)
GFR: 91.01 mL/min (ref >60.00)
Glucose, Bld: 101 mg/dL — ABNORMAL HIGH (ref 70–99)
Potassium: 3.9 mEq/L (ref 3.5–5.1)
Sodium: 137 mEq/L (ref 135–145)
Total Bilirubin: 0.5 mg/dL (ref 0.2–1.2)
Total Protein: 7.3 g/dL (ref 6.0–8.3)

## 2017-04-15 LAB — CBC
HCT: 45.6 % (ref 39.0–52.0)
Hemoglobin: 15.3 g/dL (ref 13.0–17.0)
MCHC: 33.7 g/dL (ref 30.0–36.0)
MCV: 93.9 fl (ref 78.0–100.0)
Platelets: 183 10*3/uL (ref 150.0–400.0)
RBC: 4.85 Mil/uL (ref 4.22–5.81)
RDW: 13.1 % (ref 11.5–15.5)
WBC: 3.7 10*3/uL — ABNORMAL LOW (ref 4.0–10.5)

## 2017-04-15 NOTE — Addendum Note (Signed)
Addended by: Lurlean Nanny on: 04/15/2017 11:40 AM   Modules accepted: Orders

## 2017-04-15 NOTE — Patient Instructions (Signed)

## 2017-04-15 NOTE — Progress Notes (Signed)
HPI  Pt presents to the clinic today with c/o cough. He reports this started 3 weeks ago. The cough is productive of clear mucous. He reports chills and sweats but has not check his temperature. He denies runny nose, nasal congestion, ear pain or sore throat. He has taken Tylenol and Robitussin with some relief. He has no history of allergies or breathing problems. He has not had sick contacts that he is aware of.  Review of Systems      Past Medical History:  Diagnosis Date  . Glaucoma   . Hyperlipemia   . Spinal stenosis of lumbar region with neurogenic claudication     Family History  Problem Relation Age of Onset  . Arthritis Mother   . Hypercholesterolemia Mother   . Hypertension Mother   . Diabetes Mother   . Hypertension Sister   . Hearing loss Neg Hx   . Cancer Neg Hx     Social History   Socioeconomic History  . Marital status: Married    Spouse name: Not on file  . Number of children: 3  . Years of education: Not on file  . Highest education level: Not on file  Social Needs  . Financial resource strain: Not on file  . Food insecurity - worry: Not on file  . Food insecurity - inability: Not on file  . Transportation needs - medical: Not on file  . Transportation needs - non-medical: Not on file  Occupational History  . Occupation: Air traffic controller  Tobacco Use  . Smoking status: Never Smoker  . Smokeless tobacco: Never Used  Substance and Sexual Activity  . Alcohol use: Yes    Comment: occasional  . Drug use: Not on file  . Sexual activity: Not on file  Other Topics Concern  . Not on file  Social History Narrative  . Not on file    Allergies  Allergen Reactions  . Bactrim [Sulfamethoxazole-Trimethoprim] Rash  . Shellfish Allergy Swelling     Constitutional: Denies headache, fatigue, fever or abrupt weight changes.  HEENT:  Denies eye redness, eye pain, pressure behind the eyes, facial pain, nasal congestion, ear pain, ringing in the ears,  wax buildup, runny nose or sore throat. Respiratory: Positive cough. Denies difficulty breathing or shortness of breath.  Cardiovascular: Denies chest pain, chest tightness, palpitations or swelling in the hands or feet.   No other specific complaints in a complete review of systems (except as listed in HPI above).  Objective:   BP 122/84   Pulse 78   Temp 99.1 F (37.3 C) (Oral)   Wt 214 lb (97.1 kg)   SpO2 98%   BMI 32.54 kg/m   Wt Readings from Last 3 Encounters:  04/15/17 214 lb (97.1 kg)  05/16/16 209 lb (94.8 kg)  05/15/16 211 lb (95.7 kg)     General: Appears his stated age, well developed, well nourished in NAD. HEENT: Head: normal shape and size, no sinus tenderness noted; Ears: Tm's gray and intact, normal light reflex; Throat/Mouth: + PND. Teeth present, mucosa erythematous and moist, no exudate noted, no lesions or ulcerations noted.  Neck: No cervical lymphadenopathy.  Cardiovascular: Normal rate and rhythm. S1,S2 noted.  No murmur, rubs or gallops noted.  Pulmonary/Chest: Normal effort and positive vesicular breath sounds. No respiratory distress. No wheezes, rales or ronchi noted.       Assessment & Plan:   Fever and Chills:  Get some rest and drink plenty of water Urinalysis: normal Will check CBC and  CMET today Ibuprofen as needed  Push fluids  Cough secondary to PND:  Start Allegra daily x 2 weeks  RTC as needed or if symptoms persist.   Webb Silversmith, NP

## 2017-05-02 ENCOUNTER — Encounter: Payer: Self-pay | Admitting: Internal Medicine

## 2017-05-02 ENCOUNTER — Ambulatory Visit (INDEPENDENT_AMBULATORY_CARE_PROVIDER_SITE_OTHER): Payer: BLUE CROSS/BLUE SHIELD | Admitting: Internal Medicine

## 2017-05-02 VITALS — BP 136/86 | HR 59 | Temp 98.1°F | Ht 69.0 in | Wt 211.0 lb

## 2017-05-02 DIAGNOSIS — D171 Benign lipomatous neoplasm of skin and subcutaneous tissue of trunk: Secondary | ICD-10-CM

## 2017-05-02 DIAGNOSIS — Z0000 Encounter for general adult medical examination without abnormal findings: Secondary | ICD-10-CM

## 2017-05-02 DIAGNOSIS — E785 Hyperlipidemia, unspecified: Secondary | ICD-10-CM

## 2017-05-02 DIAGNOSIS — M48062 Spinal stenosis, lumbar region with neurogenic claudication: Secondary | ICD-10-CM

## 2017-05-02 MED ORDER — ATORVASTATIN CALCIUM 20 MG PO TABS: 20.0000 mg | ORAL_TABLET | Freq: Every day | ORAL | 3 refills | Status: DC

## 2017-05-02 NOTE — Assessment & Plan Note (Signed)
Bothering him Will set up with surgeon

## 2017-05-02 NOTE — Progress Notes (Signed)
Subjective:    Patient ID: Jason Bradley, male    DOB: [birth year], 57 y.o.   MRN: [id number]  HPI Here for physical  Still having low back problems Will stiffen up and has spasm--notices it during the day Some pain and numbness in legs if he is walking for a while  He does try to stretch when he can Only can walk for a minute or so--- discussed trying weights/core work He has tried a back brace--this helps some  Wants lipoma taken off--from back  Sees urologist--- some delay in flow Just for check up In [place]  Current Outpatient Medications on File Prior to Visit  Medication Sig Dispense Refill  . Flaxseed, Linseed, (FLAX SEED OIL) 1000 MG CAPS Take 1 capsule by mouth daily.    . Multiple Vitamins-Minerals (MULTIVITAMIN MEN PO) Take 1 tablet by mouth daily.    . Omega-3 Fatty Acids (FISH OIL) 1000 MG CAPS Take 1 capsule by mouth daily.    . Red Yeast Rice 600 MG TABS Take 1 tablet by mouth daily.    . timolol (TIMOPTIC) 0.5 % ophthalmic solution Place 1 drop into both eyes daily.     No current facility-administered medications on file prior to visit.     Allergies  Allergen Reactions  . Bactrim [Sulfamethoxazole-Trimethoprim] Rash  . Shellfish Allergy Swelling    Past Medical History:  Diagnosis Date  . Glaucoma   . Hyperlipemia   . Spinal stenosis of lumbar region with neurogenic claudication     Past Surgical History:  Procedure Laterality Date  . HERNIA REPAIR  1972    Family History  Problem Relation Age of Onset  . Arthritis Mother   . Hypercholesterolemia Mother   . Hypertension Mother   . Diabetes Mother   . Hypertension Sister   . Hearing loss Neg Hx   . Cancer Neg Hx     Social History   Socioeconomic History  . Marital status: Married    Spouse name: Not on file  . Number of children: 3  . Years of education: Not on file  . Highest education level: Not on file  Occupational History  . Occupation: Air traffic controller    Social Needs  . Financial resource strain: Not on file  . Food insecurity:    Worry: Not on file    Inability: Not on file  . Transportation needs:    Medical: Not on file    Non-medical: Not on file  Tobacco Use  . Smoking status: Never Smoker  . Smokeless tobacco: Never Used  Substance and Sexual Activity  . Alcohol use: Yes    Comment: occasional  . Drug use: Not on file  . Sexual activity: Not on file  Lifestyle  . Physical activity:    Days per week: Not on file    Minutes per session: Not on file  . Stress: Not on file  Relationships  . Social connections:    Talks on phone: Not on file    Gets together: Not on file    Attends religious service: Not on file    Active member of club or organization: Not on file    Attends meetings of clubs or organizations: Not on file    Relationship status: Not on file  . Intimate partner violence:    Fear of current or ex partner: Not on file    Emotionally abused: Not on file    Physically abused: Not on file    Forced sexual  activity: Not on file  Other Topics Concern  . Not on file  Social History Narrative  . Not on file   Review of Systems  Constitutional: Negative for fatigue and unexpected weight change.       Wears seat belt  HENT: Negative for dental problem, hearing loss, tinnitus and trouble swallowing.        Keeps up with dentist  Eyes: Negative for visual disturbance.       No diplopia or unilateral vision loss (prism resolved the diplopia)  Respiratory: Negative for cough, chest tightness and shortness of breath.   Cardiovascular: Negative for chest pain, palpitations and leg swelling.  Gastrointestinal: Negative for blood in stool and constipation.       No heartburn  Endocrine: Negative for polydipsia and polyuria.  Genitourinary: Positive for difficulty urinating.       No sexual problems  Musculoskeletal: Positive for back pain. Negative for arthralgias and joint swelling.  Skin: Negative for rash.        No suspicious lesions  Allergic/Immunologic: Negative for environmental allergies and immunocompromised state.  Neurological: Negative for dizziness, syncope, light-headedness and headaches.  Hematological: Negative for adenopathy. Does not bruise/bleed easily.  Psychiatric/Behavioral: Negative for dysphoric mood and sleep disturbance. The patient is not nervous/anxious.        Objective:   Physical Exam  Constitutional: He is oriented to person, place, and time. He appears well-developed. No distress.  HENT:  Head: Normocephalic and atraumatic.  Right Ear: External ear normal.  Left Ear: External ear normal.  Mouth/Throat: Oropharynx is clear and moist. No oropharyngeal exudate.  Eyes: Pupils are equal, round, and reactive to light. Conjunctivae are normal.  Neck: No thyromegaly present.  Cardiovascular: Normal rate, regular rhythm, normal heart sounds and intact distal pulses. Exam reveals no gallop.  No murmur heard. Pulmonary/Chest: Effort normal and breath sounds normal. No respiratory distress. He has no wheezes. He has no rales.  Abdominal: Soft. There is no tenderness.  Musculoskeletal: He exhibits no edema or tenderness.  Lymphadenopathy:    He has no cervical adenopathy.  Neurological: He is alert and oriented to person, place, and time.  Skin: No rash noted.  Small papule left hip--- can try cortisone cream but likely needs excision (hasn't progressed over a year though)  Psychiatric: He has a normal mood and affect. His behavior is normal.          Assessment & Plan:

## 2017-05-02 NOTE — Assessment & Plan Note (Signed)
Healthy Discussed fitness---really needs work due to back PSA recently done Colon due 2022 Yearly flu vaccine

## 2017-05-02 NOTE — Assessment & Plan Note (Signed)
Ongoing symptoms Discussed core strength and aerobic work in gym

## 2017-05-02 NOTE — Assessment & Plan Note (Signed)
Discussed more effective primary prevention with statin

## 2017-05-12 ENCOUNTER — Encounter: Payer: Self-pay | Admitting: *Deleted

## 2017-06-23 ENCOUNTER — Other Ambulatory Visit (INDEPENDENT_AMBULATORY_CARE_PROVIDER_SITE_OTHER): Payer: BLUE CROSS/BLUE SHIELD

## 2017-06-23 DIAGNOSIS — E785 Hyperlipidemia, unspecified: Secondary | ICD-10-CM | POA: Diagnosis not present

## 2017-06-23 LAB — HEPATIC FUNCTION PANEL
ALT: 30 U/L (ref 0–53)
AST: 23 U/L (ref 0–37)
Albumin: 4.1 g/dL (ref 3.5–5.2)
Alkaline Phosphatase: 48 U/L (ref 39–117)
Bilirubin, Direct: 0.1 mg/dL (ref 0.0–0.3)
Total Bilirubin: 0.4 mg/dL (ref 0.2–1.2)
Total Protein: 7.2 g/dL (ref 6.0–8.3)

## 2017-06-23 LAB — LIPID PANEL
Cholesterol: 169 mg/dL (ref 0–200)
HDL: 52.3 mg/dL (ref >39.00)
LDL Cholesterol: 97 mg/dL (ref 0–99)
NonHDL: 116.25
Total CHOL/HDL Ratio: 3
Triglycerides: 96 mg/dL (ref 0.0–149.0)
VLDL: 19.2 mg/dL (ref 0.0–40.0)

## 2017-10-16 IMAGING — CT CT HUMERUS*R* W/CM
3 series · 9 of 33 positions shown, 10 images · IV contrast (ISOVUE 300)
Comparison: None.

CLINICAL DATA: 5 day history of redness, swelling and pain in the
right axilla and upper arm.

EXAM:
CT OF THE UPPER RIGHT EXTREMITY WITH CONTRAST
TECHNIQUE: Multidetector CT imaging of the upper right extremity was performed
according to the standard protocol following intravenous contrast
administration.

[Series 4: upper ext st · axial · 0.34mm/px · z∈[-174,-174]mm · 1 of 120 slices shown, 2 images]
[im 65/120  soft-tissue]
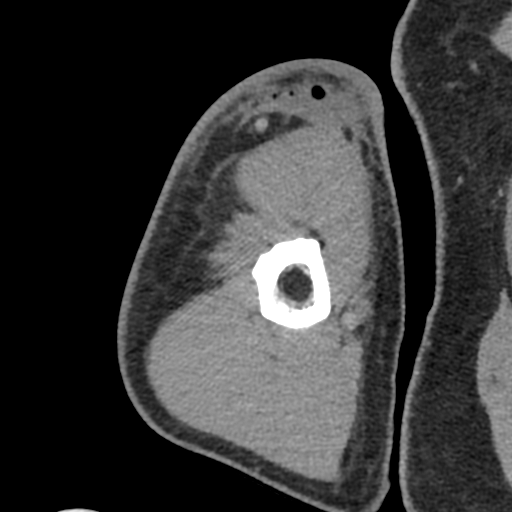
[im 65/120  bone]
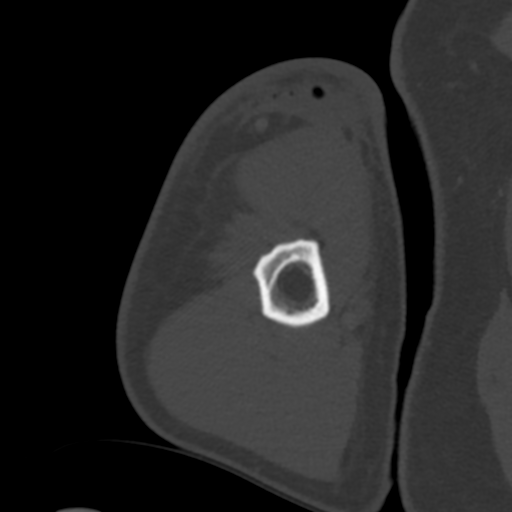

[Series 11: coronal st · coronal · 0.33mm/px · 3 of 101 slices shown]
[im 21/101  bone]
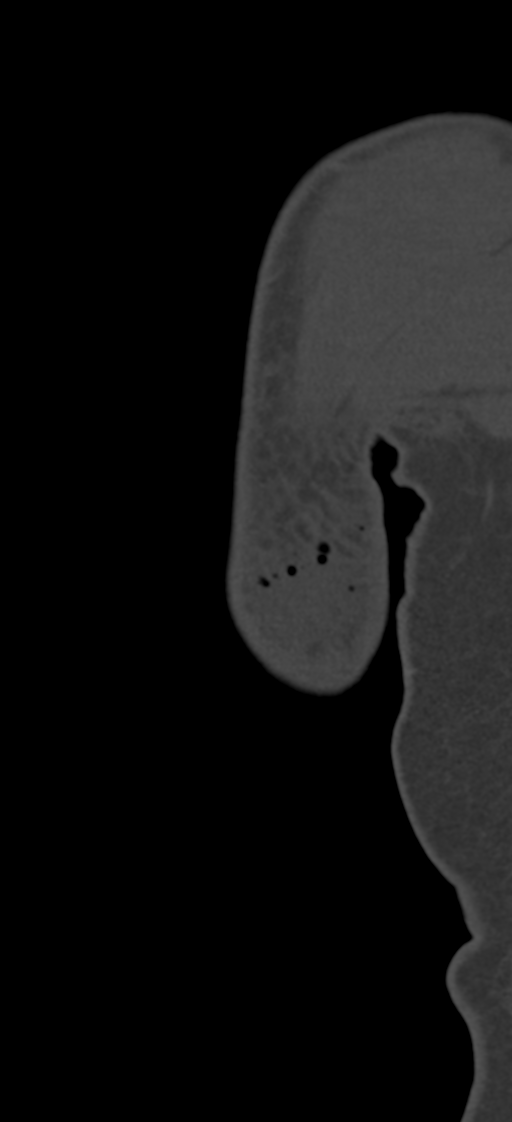
[im 41/101  bone]
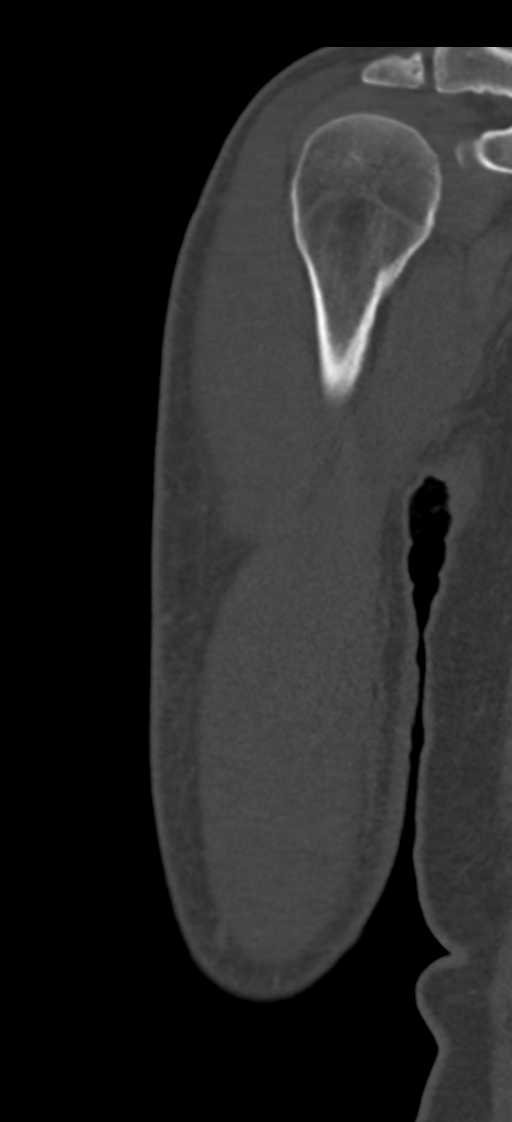
[im 61/101  bone]
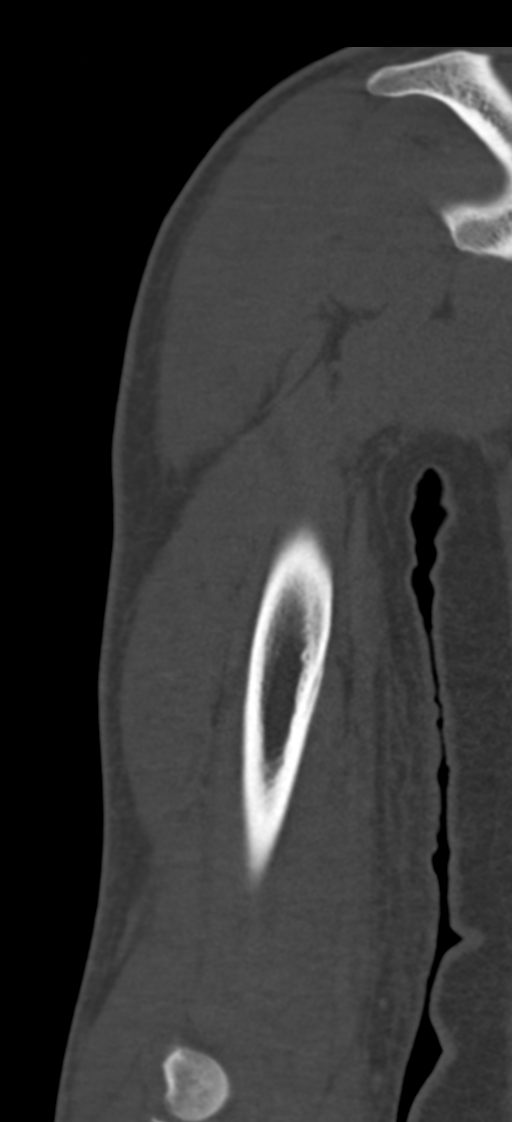

[Series 12: sagittal st · sagittal · 0.31mm/px · 5 of 72 slices shown]
[im 24/72  bone]
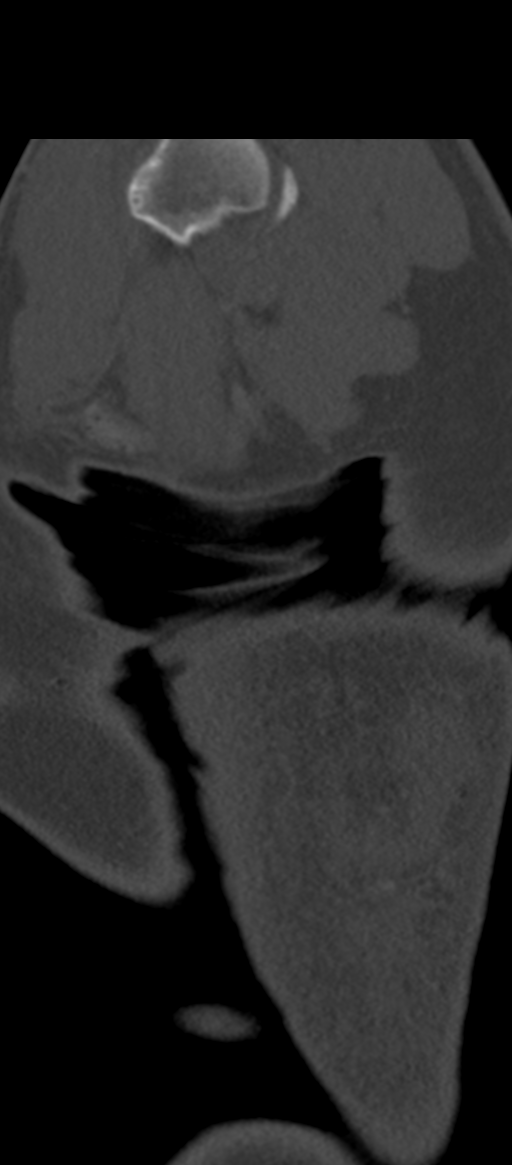
[im 30/72  bone]
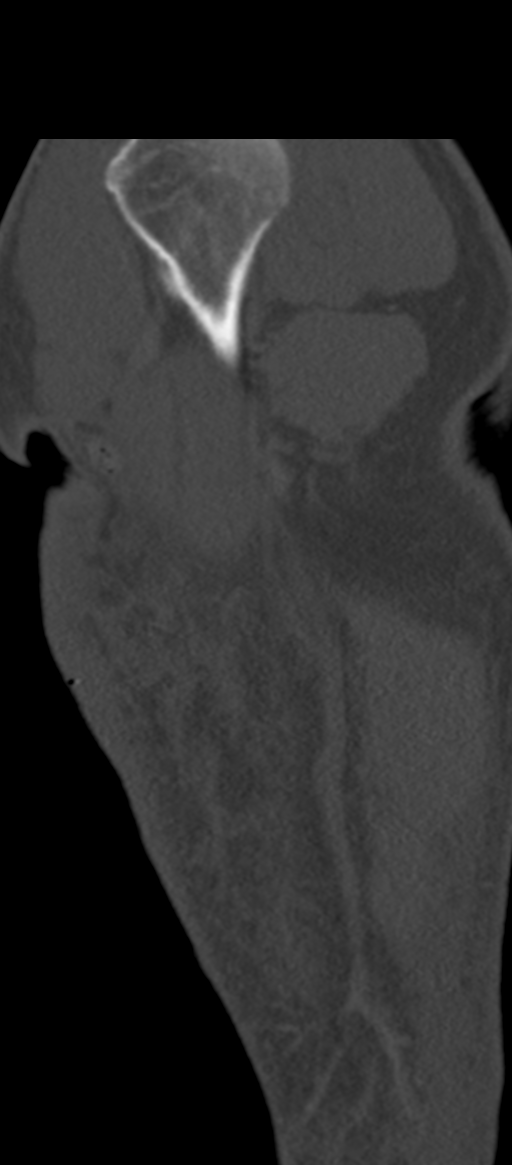
[im 36/72  bone]
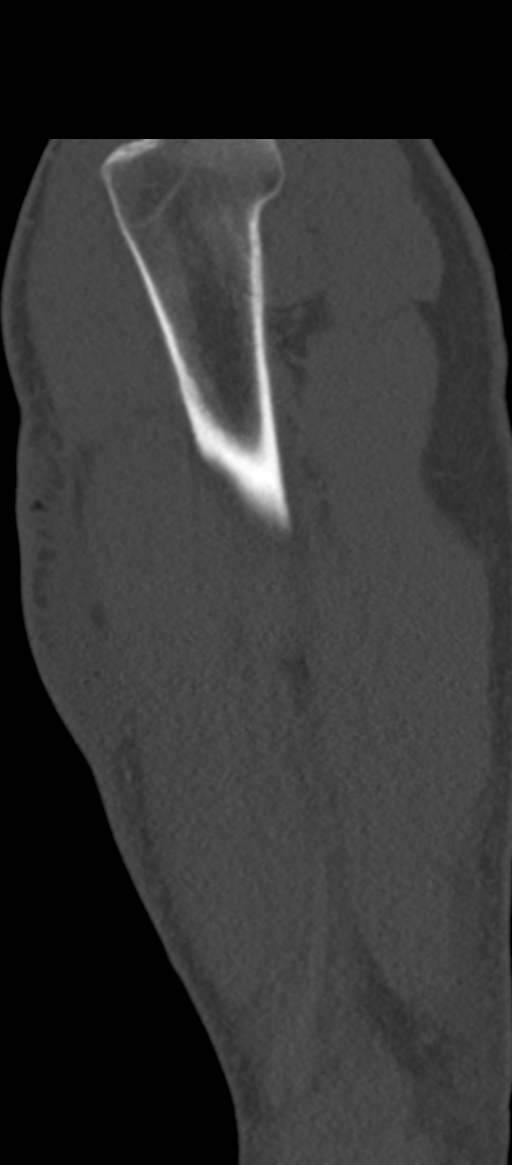
[im 42/72  bone]
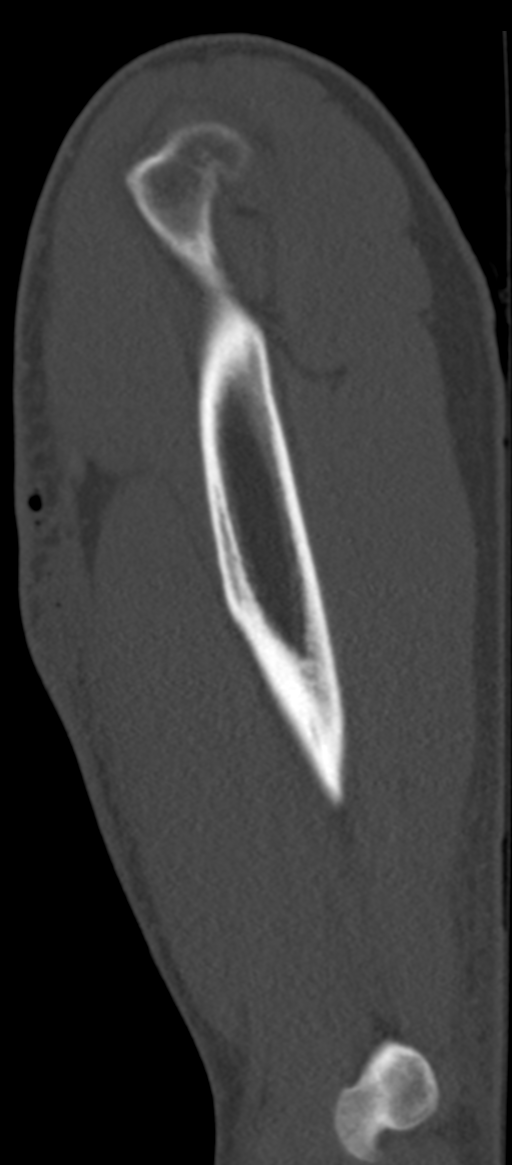
[im 48/72  bone]
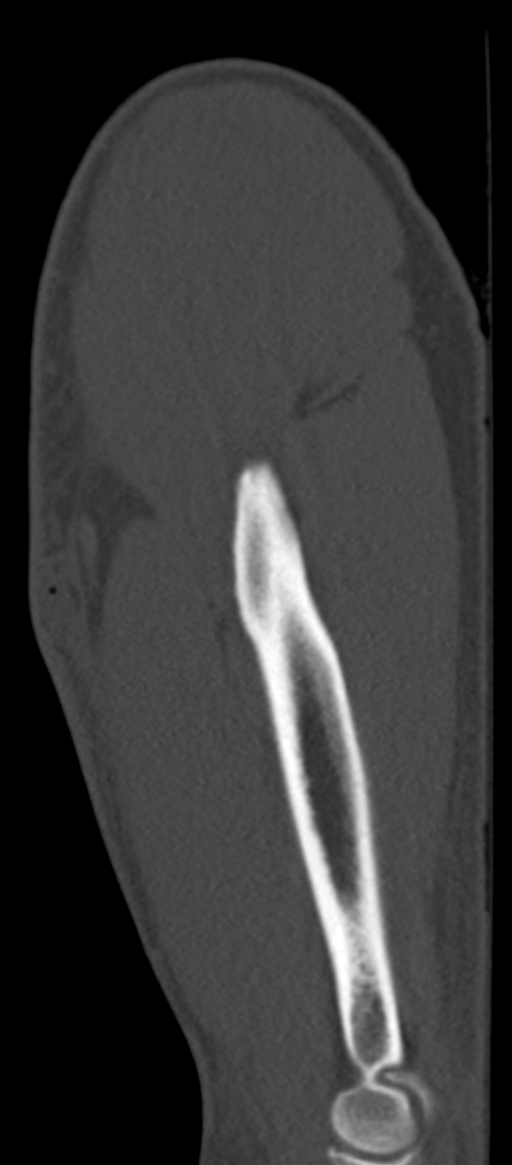

[9 of 33 positions shown; findings below may reference images not displayed]

Dr. maximum

CONTRAST:  80mL S2GFPG-III IOPAMIDOL (S2GFPG-III) INJECTION 61% at L
back maximal mild
FINDINGS: Bones/Joint/Cartilage

Normal.

Soft tissues

The patient has had a subcutaneous abscess containing pus and gas in
the subcutaneous fat of the anterior aspect of the right upper arm.
The abscess extends into the anterior aspect of the left axilla.
There is high-density material in the soft tissues suggesting that
the patient has packing in wound.

There is no extension of the infection into the underlying muscles
at this time. Subcutaneous edema does extend posteriorly to the
level of the elbow.
IMPRESSION: Findings consistent with an aerobic gas-forming abscess and
cellulitis in the subcutaneous fat of the right axilla and anterior
aspect of the right upper arm superficial to the biceps muscle. This
report was called to Dr. [REDACTED] at [DATE] p.m. by Dr. Djilda Aminova.

## 2018-02-03 ENCOUNTER — Telehealth: Payer: Self-pay

## 2018-02-03 NOTE — Telephone Encounter (Signed)
Please check on him on Thursday

## 2018-02-03 NOTE — Telephone Encounter (Signed)
Anderson Malta (DPR signed) said pt got bit by something on 01/29/18; pt took benadryl on 01/30/18; pt noticed swelling at lt wrist with some redness on 01/30/18; today the area is still swollen and red circle the size of a quarter with a scab in the center. The wrist is aching and still itching. Since the lt wrist is swollen, red, itching and throbbing there are no available appts at [facility] so pt will go to [facility] for eval. FYI to Dr Silvio Pate.

## 2018-02-05 NOTE — Telephone Encounter (Signed)
Spoke to pt's wife per DPR. He went to [facility] yesterday and started an antibiotic.

## 2018-03-03 ENCOUNTER — Telehealth: Payer: Self-pay

## 2018-03-03 NOTE — Telephone Encounter (Signed)
Okay Doesn't sound like an emergency

## 2018-03-03 NOTE — Telephone Encounter (Signed)
Pt 's wife (DPR signed) said pt has dizziness on and off for approx 1 wk. No H/A, congestion, fever or cough, CP or SOB. pts wife wants to schedule appt with Dr Silvio Pate only; pt is working so has specific times can make appt. Pt scheduled appt with Dr Silvio Pate on 03/05/18 at [facility]. (offered appt today with another provider but declined appt by pts wife. Pt could not come this morning at all due to work meeting. FYI to Dr Silvio Pate.

## 2018-03-05 ENCOUNTER — Ambulatory Visit: Payer: BLUE CROSS/BLUE SHIELD | Admitting: Internal Medicine

## 2018-03-05 ENCOUNTER — Encounter: Payer: Self-pay | Admitting: Internal Medicine

## 2018-03-05 VITALS — BP 152/90 | HR 62 | Temp 97.6°F | Ht 69.0 in | Wt 219.0 lb

## 2018-03-05 DIAGNOSIS — J0110 Acute frontal sinusitis, unspecified: Secondary | ICD-10-CM | POA: Diagnosis not present

## 2018-03-05 NOTE — Progress Notes (Signed)
Subjective:    Patient ID: Jason Bradley, male    DOB: 07-04-1960, 58 y.o.   MRN: [id number]  HPI Here due to dizziness  Started about a week ago Balance felt off trying to walk Fever off and on---chills that day Rested over last weekend--thought he might have mild flu Felt better after resting  Seemed better But now having pressure on eyes and ears Slight congestion and rhinorrhea No fever Doesn't really feel sick Dizziness is better No distinct headaches Is prone to sinus problems---no recent treatment for this No SOB  No Rx for this  Current Outpatient Medications on File Prior to Visit  Medication Sig Dispense Refill  . atorvastatin (LIPITOR) 20 MG tablet Take 1 tablet (20 mg total) by mouth daily. 90 tablet 3  . Multiple Vitamins-Minerals (MULTIVITAMIN MEN PO) Take 1 tablet by mouth daily.    . timolol (TIMOPTIC) 0.5 % ophthalmic solution Place 1 drop into both eyes daily.     No current facility-administered medications on file prior to visit.     Allergies  Allergen Reactions  . Bactrim [Sulfamethoxazole-Trimethoprim] Rash  . Shellfish Allergy Swelling    Past Medical History:  Diagnosis Date  . Glaucoma   . Hyperlipemia   . Spinal stenosis of lumbar region with neurogenic claudication     Past Surgical History:  Procedure Laterality Date  . HERNIA REPAIR  1972    Family History  Problem Relation Age of Onset  . Arthritis Mother   . Hypercholesterolemia Mother   . Hypertension Mother   . Diabetes Mother   . Hypertension Sister   . Hearing loss Neg Hx   . Cancer Neg Hx     Social History   Socioeconomic History  . Marital status: Married    Spouse name: Not on file  . Number of children: 3  . Years of education: Not on file  . Highest education level: Not on file  Occupational History  . Occupation: Air traffic controller  Social Needs  . Financial resource strain: Not on file  . Food insecurity:    Worry: Not on file    Inability: Not  on file  . Transportation needs:    Medical: Not on file    Non-medical: Not on file  Tobacco Use  . Smoking status: Never Smoker  . Smokeless tobacco: Never Used  Substance and Sexual Activity  . Alcohol use: Yes    Comment: occasional  . Drug use: Not on file  . Sexual activity: Not on file  Lifestyle  . Physical activity:    Days per week: Not on file    Minutes per session: Not on file  . Stress: Not on file  Relationships  . Social connections:    Talks on phone: Not on file    Gets together: Not on file    Attends religious service: Not on file    Active member of club or organization: Not on file    Attends meetings of clubs or organizations: Not on file    Relationship status: Not on file  . Intimate partner violence:    Fear of current or ex partner: Not on file    Emotionally abused: Not on file    Physically abused: Not on file    Forced sexual activity: Not on file  Other Topics Concern  . Not on file  Social History Narrative  . Not on file   Review of Systems No problems with motion sickness Some chronic hearing problems  No vertigo No N/V Eating okay Bad infection on left wrist New Year's eve--shows me pictures of nasty lesion---seen in urgent care and is now mostly better    Objective:   Physical Exam  Constitutional: He appears well-developed. No distress.  HENT:  No sinus tenderness Moderate nasal inflammation TMs normal Pharynx without injection or exudate  Neck: No thyromegaly present.  Respiratory: Effort normal and breath sounds normal. No respiratory distress. He has no wheezes. He has no rales.  Lymphadenopathy:    He has no cervical adenopathy.           Assessment & Plan:

## 2018-03-05 NOTE — Assessment & Plan Note (Signed)
Seems to be the remnants of viral infection that hit harder 1 week ago Discussed analgesics If worsens next week, would send Rx for amoxicillin

## 2018-03-13 ENCOUNTER — Encounter: Payer: Self-pay | Admitting: Internal Medicine

## 2018-05-06 ENCOUNTER — Encounter: Payer: BLUE CROSS/BLUE SHIELD | Admitting: Internal Medicine

## 2018-05-22 ENCOUNTER — Telehealth: Payer: Self-pay

## 2018-05-22 NOTE — Telephone Encounter (Signed)
Pt declined Virtual CPE

## 2018-06-15 ENCOUNTER — Other Ambulatory Visit: Payer: Self-pay | Admitting: Internal Medicine

## 2018-10-13 ENCOUNTER — Ambulatory Visit (INDEPENDENT_AMBULATORY_CARE_PROVIDER_SITE_OTHER): Payer: BC Managed Care – PPO

## 2018-10-13 DIAGNOSIS — Z23 Encounter for immunization: Secondary | ICD-10-CM | POA: Diagnosis not present

## 2019-04-09 ENCOUNTER — Ambulatory Visit: Payer: Self-pay

## 2019-04-10 ENCOUNTER — Ambulatory Visit: Payer: 59 | Attending: Internal Medicine

## 2019-04-10 DIAGNOSIS — Z23 Encounter for immunization: Secondary | ICD-10-CM | POA: Insufficient documentation

## 2019-04-10 NOTE — Progress Notes (Signed)
   Covid-19 Vaccination Clinic  Name:  Jason Bradley    MRN: [id number] DOB: 06-12-1960  04/10/2019  Mr. Jason Bradley was observed post Covid-19 immunization for 15 minutes without incident. He was provided with Vaccine Information Sheet and instruction to access the V-Safe system.   Mr. Jason Bradley was instructed to call 911 with any severe reactions post vaccine: Marland Kitchen Difficulty breathing  . Swelling of face and throat  . A fast heartbeat  . A bad rash all over body  . Dizziness and weakness   Immunizations Administered    Name Date Dose VIS Date Route   Moderna COVID-19 Vaccine 04/10/2019  4:07 PM 0.5 mL 01/05/2019 Intramuscular   Manufacturer: Moderna   Lot: [id number]   [place] [id number]

## 2019-04-13 ENCOUNTER — Encounter: Payer: BC Managed Care – PPO | Admitting: Internal Medicine

## 2019-04-28 ENCOUNTER — Other Ambulatory Visit: Payer: Self-pay | Admitting: Internal Medicine

## 2019-04-28 ENCOUNTER — Other Ambulatory Visit: Payer: Self-pay

## 2019-04-28 ENCOUNTER — Encounter: Payer: Self-pay | Admitting: Internal Medicine

## 2019-04-28 ENCOUNTER — Ambulatory Visit (INDEPENDENT_AMBULATORY_CARE_PROVIDER_SITE_OTHER): Payer: 59 | Admitting: Internal Medicine

## 2019-04-28 VITALS — BP 138/86 | HR 62 | Temp 97.7°F | Ht 69.0 in | Wt 215.0 lb

## 2019-04-28 DIAGNOSIS — R972 Elevated prostate specific antigen [PSA]: Secondary | ICD-10-CM

## 2019-04-28 DIAGNOSIS — Z0000 Encounter for general adult medical examination without abnormal findings: Secondary | ICD-10-CM | POA: Diagnosis not present

## 2019-04-28 DIAGNOSIS — E785 Hyperlipidemia, unspecified: Secondary | ICD-10-CM

## 2019-04-28 DIAGNOSIS — Z125 Encounter for screening for malignant neoplasm of prostate: Secondary | ICD-10-CM

## 2019-04-28 DIAGNOSIS — R200 Anesthesia of skin: Secondary | ICD-10-CM

## 2019-04-28 LAB — COMPREHENSIVE METABOLIC PANEL
ALT: 25 U/L (ref 0–53)
AST: 19 U/L (ref 0–37)
Albumin: 4.2 g/dL (ref 3.5–5.2)
Alkaline Phosphatase: 53 U/L (ref 39–117)
BUN: 13 mg/dL (ref 6–23)
CO2: 29 mEq/L (ref 19–32)
Calcium: 9.4 mg/dL (ref 8.4–10.5)
Chloride: 104 mEq/L (ref 96–112)
Creatinine, Ser: 0.93 mg/dL (ref 0.40–1.50)
GFR: 99.97 mL/min (ref >60.00)
Glucose, Bld: 101 mg/dL — ABNORMAL HIGH (ref 70–99)
Potassium: 4.1 mEq/L (ref 3.5–5.1)
Sodium: 140 mEq/L (ref 135–145)
Total Bilirubin: 0.6 mg/dL (ref 0.2–1.2)
Total Protein: 7 g/dL (ref 6.0–8.3)

## 2019-04-28 LAB — CBC
HCT: 44.1 % (ref 39.0–52.0)
Hemoglobin: 14.8 g/dL (ref 13.0–17.0)
MCHC: 33.6 g/dL (ref 30.0–36.0)
MCV: 94.9 fl (ref 78.0–100.0)
Platelets: 265 10*3/uL (ref 150.0–400.0)
RBC: 4.65 Mil/uL (ref 4.22–5.81)
RDW: 13.6 % (ref 11.5–15.5)
WBC: 3.4 10*3/uL — ABNORMAL LOW (ref 4.0–10.5)

## 2019-04-28 LAB — LIPID PANEL
Cholesterol: 193 mg/dL (ref 0–200)
HDL: 54.1 mg/dL (ref >39.00)
LDL Cholesterol: 121 mg/dL — ABNORMAL HIGH (ref 0–99)
NonHDL: 139.27
Total CHOL/HDL Ratio: 4
Triglycerides: 91 mg/dL (ref 0.0–149.0)
VLDL: 18.2 mg/dL (ref 0.0–40.0)

## 2019-04-28 LAB — VITAMIN B12: Vitamin B-12: 367 pg/mL (ref 211–911)

## 2019-04-28 LAB — T4, FREE: Free T4: 0.76 ng/dL (ref 0.60–1.60)

## 2019-04-28 LAB — PSA: PSA: 4.18 ng/mL — ABNORMAL HIGH (ref 0.10–4.00)

## 2019-04-28 MED ORDER — SILDENAFIL CITRATE 20 MG PO TABS: 60.0000 mg | ORAL_TABLET | Freq: Every day | ORAL | 11 refills | Status: DC | PRN

## 2019-04-28 NOTE — Assessment & Plan Note (Signed)
Healthy  Colon due next year Will check PSA Yearly flu vaccine Due for second COVID soon Works out regularly

## 2019-04-28 NOTE — Progress Notes (Signed)
Subjective:    Patient ID: Jason Bradley, male    DOB: 08-02-1960, 59 y.o.   MRN: [id number]  HPI Here for physical This visit occurred during the SARS-CoV-2 public health emergency.  Safety protocols were in place, including screening questions prior to the visit, additional usage of staff PPE, and extensive cleaning of exam room while observing appropriate contact time as indicated for disinfecting solutions.   Having trouble with ED--not holding Urine flow is okay---but doesn't empty fully for a while sometimes  Some numbness in the ball of right foot Variable  Does spend time bending with pressure on that spot a lot  Left nostril will have some blood at times Doesn't have nosebleed Does have allergies---takes allegra and flonase (discussed that this could be causing)  Current Outpatient Medications on File Prior to Visit  Medication Sig Dispense Refill  . atorvastatin (LIPITOR) 20 MG tablet Take 1 tablet (20 mg total) by mouth daily. NEEDS OFFICE VISIT 90 tablet 0  . Multiple Vitamins-Minerals (MULTIVITAMIN MEN PO) Take 1 tablet by mouth daily.    . timolol (TIMOPTIC) 0.5 % ophthalmic solution Place 1 drop into both eyes daily.     No current facility-administered medications on file prior to visit.    Allergies  Allergen Reactions  . Bactrim [Sulfamethoxazole-Trimethoprim] Rash  . Shellfish Allergy Swelling    Past Medical History:  Diagnosis Date  . Glaucoma   . Hyperlipemia   . Spinal stenosis of lumbar region with neurogenic claudication     Past Surgical History:  Procedure Laterality Date  . HERNIA REPAIR  1972    Family History  Problem Relation Age of Onset  . Arthritis Mother   . Hypercholesterolemia Mother   . Hypertension Mother   . Diabetes Mother   . Hypertension Sister   . Hearing loss Neg Hx   . Cancer Neg Hx     Social History   Socioeconomic History  . Marital status: Married    Spouse name: Not on file  . Number of children: 3  .  Years of education: Not on file  . Highest education level: Not on file  Occupational History  . Occupation: Air traffic controller  Tobacco Use  . Smoking status: Never Smoker  . Smokeless tobacco: Never Used  Substance and Sexual Activity  . Alcohol use: Yes    Comment: occasional  . Drug use: Not on file  . Sexual activity: Not on file  Other Topics Concern  . Not on file  Social History Narrative  . Not on file   Social Determinants of Health   Financial Resource Strain:   . Difficulty of Paying Living Expenses:   Food Insecurity:   . Worried About Charity fundraiser in the Last Year:   . Arboriculturist in the Last Year:   Transportation Needs:   . Film/video editor (Medical):   Marland Kitchen Lack of Transportation (Non-Medical):   Physical Activity:   . Days of Exercise per Week:   . Minutes of Exercise per Session:   Stress:   . Feeling of Stress :   Social Connections:   . Frequency of Communication with Friends and Family:   . Frequency of Social Gatherings with Friends and Family:   . Attends Religious Services:   . Active Member of Clubs or Organizations:   . Attends Archivist Meetings:   Marland Kitchen Marital Status:   Intimate Partner Violence:   . Fear of Current or Ex-Partner:   .  Emotionally Abused:   Marland Kitchen Physically Abused:   . Sexually Abused:    Review of Systems  Constitutional: Negative for fatigue and unexpected weight change.       Regular exercise  Wears seat belt  HENT: Negative for dental problem, hearing loss and tinnitus.        Keeps up with dentist  Eyes: Negative for visual disturbance.       No diplopia (with prism in glasses) or unilateral vision loss  Respiratory: Negative for cough, chest tightness and shortness of breath.   Cardiovascular: Negative for chest pain, palpitations and leg swelling.  Gastrointestinal: Negative for abdominal pain, blood in stool and constipation.       No heartburn  Endocrine: Negative for polydipsia and  polyuria.  Genitourinary: Positive for difficulty urinating. Negative for urgency.  Musculoskeletal: Negative for arthralgias, back pain and joint swelling.  Skin: Negative for rash.       Lipoma was removed  Allergic/Immunologic: Positive for environmental allergies. Negative for immunocompromised state.  Neurological: Negative for dizziness, syncope, light-headedness and headaches.  Hematological: Negative for adenopathy. Does not bruise/bleed easily.  Psychiatric/Behavioral: Negative for dysphoric mood and sleep disturbance. The patient is not nervous/anxious.        Objective:   Physical Exam  Constitutional: He is oriented to person, place, and time. He appears well-developed. No distress.  HENT:  Head: Normocephalic and atraumatic.  Right Ear: External ear normal.  Left Ear: External ear normal.  Mouth/Throat: Oropharynx is clear and moist. No oropharyngeal exudate.  Eyes: Pupils are equal, round, and reactive to light. Conjunctivae are normal.  Neck: No thyromegaly present.  Cardiovascular: Normal rate, regular rhythm, normal heart sounds and intact distal pulses. Exam reveals no gallop.  No murmur heard. Respiratory: Effort normal and breath sounds normal. No respiratory distress. He has no wheezes. He has no rales.  GI: Soft. There is no abdominal tenderness.  Musculoskeletal:        General: No tenderness or edema.  Lymphadenopathy:    He has no cervical adenopathy.  Neurological: He is alert and oriented to person, place, and time.  Skin: No rash noted. No erythema.  Psychiatric: He has a normal mood and affect. His behavior is normal.           Assessment & Plan:

## 2019-04-28 NOTE — Patient Instructions (Signed)
Check with the pharmacist about prices for the written sildenafil prescription. You can also check for tadalafil 10 or 20mg  prices

## 2019-04-28 NOTE — Assessment & Plan Note (Signed)
Statin for primary prevention 

## 2019-04-28 NOTE — Assessment & Plan Note (Signed)
Just on right foot I suspect mechanical ---doubt related to statin Will check labs He will try to change foot position

## 2019-04-28 NOTE — Progress Notes (Signed)
Hearing Screening   Method: Audiometry   125Hz  250Hz  500Hz  1000Hz  2000Hz  3000Hz  4000Hz  6000Hz  8000Hz   Right ear:   20 20 20  20     Left ear:   20 20 20   20

## 2019-05-08 ENCOUNTER — Other Ambulatory Visit: Payer: Self-pay

## 2019-05-08 ENCOUNTER — Ambulatory Visit: Payer: 59 | Attending: Internal Medicine

## 2019-05-08 DIAGNOSIS — Z23 Encounter for immunization: Secondary | ICD-10-CM

## 2019-05-08 NOTE — Progress Notes (Signed)
   Covid-19 Vaccination Clinic  Name:  Jason Bradley    MRN: [id number] DOB: [birth year]  05/08/2019  Mr. Ziman was observed post Covid-19 immunization for 15 minutes without incident. He was provided with Vaccine Information Sheet and instruction to access the V-Safe system.   Mr. Arena was instructed to call 911 with any severe reactions post vaccine: Marland Kitchen Difficulty breathing  . Swelling of face and throat  . A fast heartbeat  . A bad rash all over body  . Dizziness and weakness   Immunizations Administered    Name Date Dose VIS Date Route   Moderna COVID-19 Vaccine 05/08/2019  8:16 AM 0.5 mL 01/05/2019 Intramuscular   Manufacturer: Levan Hurst   [id number]   [id number]

## 2019-07-01 ENCOUNTER — Other Ambulatory Visit: Payer: Self-pay | Admitting: Internal Medicine

## 2019-08-03 ENCOUNTER — Other Ambulatory Visit (INDEPENDENT_AMBULATORY_CARE_PROVIDER_SITE_OTHER): Payer: 59

## 2019-08-03 DIAGNOSIS — R972 Elevated prostate specific antigen [PSA]: Secondary | ICD-10-CM

## 2019-08-04 LAB — PSA, TOTAL AND FREE
PSA, % Free: 12 % (calc) — ABNORMAL LOW (ref >25)
PSA, Free: 0.4 ng/mL
PSA, Total: 3.3 ng/mL (ref <=4.0)

## 2019-10-27 ENCOUNTER — Other Ambulatory Visit: Payer: Self-pay

## 2019-10-27 ENCOUNTER — Ambulatory Visit (INDEPENDENT_AMBULATORY_CARE_PROVIDER_SITE_OTHER): Payer: 59

## 2019-10-27 DIAGNOSIS — Z23 Encounter for immunization: Secondary | ICD-10-CM

## 2020-05-02 ENCOUNTER — Ambulatory Visit (INDEPENDENT_AMBULATORY_CARE_PROVIDER_SITE_OTHER): Payer: 59 | Admitting: Internal Medicine

## 2020-05-02 ENCOUNTER — Other Ambulatory Visit: Payer: Self-pay

## 2020-05-02 ENCOUNTER — Encounter: Payer: Self-pay | Admitting: Internal Medicine

## 2020-05-02 VITALS — BP 126/76 | HR 56 | Temp 96.7°F | Ht 69.0 in | Wt 215.0 lb

## 2020-05-02 DIAGNOSIS — J301 Allergic rhinitis due to pollen: Secondary | ICD-10-CM

## 2020-05-02 DIAGNOSIS — Z125 Encounter for screening for malignant neoplasm of prostate: Secondary | ICD-10-CM

## 2020-05-02 DIAGNOSIS — Z0000 Encounter for general adult medical examination without abnormal findings: Secondary | ICD-10-CM

## 2020-05-02 DIAGNOSIS — E785 Hyperlipidemia, unspecified: Secondary | ICD-10-CM | POA: Diagnosis not present

## 2020-05-02 LAB — COMPREHENSIVE METABOLIC PANEL
ALT: 35 U/L (ref 0–53)
AST: 25 U/L (ref 0–37)
Albumin: 4.4 g/dL (ref 3.5–5.2)
Alkaline Phosphatase: 54 U/L (ref 39–117)
BUN: 10 mg/dL (ref 6–23)
CO2: 30 mEq/L (ref 19–32)
Calcium: 9.8 mg/dL (ref 8.4–10.5)
Chloride: 103 mEq/L (ref 96–112)
Creatinine, Ser: 0.99 mg/dL (ref 0.40–1.50)
GFR: 81.97 mL/min (ref >60.00)
Glucose, Bld: 106 mg/dL — ABNORMAL HIGH (ref 70–99)
Potassium: 4 mEq/L (ref 3.5–5.1)
Sodium: 140 mEq/L (ref 135–145)
Total Bilirubin: 0.7 mg/dL (ref 0.2–1.2)
Total Protein: 7.4 g/dL (ref 6.0–8.3)

## 2020-05-02 LAB — CBC
HCT: 47.1 % (ref 39.0–52.0)
Hemoglobin: 15.7 g/dL (ref 13.0–17.0)
MCHC: 33.3 g/dL (ref 30.0–36.0)
MCV: 94.4 fl (ref 78.0–100.0)
Platelets: 231 10*3/uL (ref 150.0–400.0)
RBC: 4.99 Mil/uL (ref 4.22–5.81)
RDW: 13.6 % (ref 11.5–15.5)
WBC: 3.3 10*3/uL — ABNORMAL LOW (ref 4.0–10.5)

## 2020-05-02 LAB — LIPID PANEL
Cholesterol: 180 mg/dL (ref 0–200)
HDL: 51.1 mg/dL (ref >39.00)
LDL Cholesterol: 104 mg/dL — ABNORMAL HIGH (ref 0–99)
NonHDL: 129.2
Total CHOL/HDL Ratio: 4
Triglycerides: 126 mg/dL (ref 0.0–149.0)
VLDL: 25.2 mg/dL (ref 0.0–40.0)

## 2020-05-02 LAB — PSA: PSA: 3.24 ng/mL (ref 0.10–4.00)

## 2020-05-02 MED ORDER — SILDENAFIL CITRATE 20 MG PO TABS: 60.0000 mg | ORAL_TABLET | Freq: Every day | ORAL | 11 refills | Status: DC | PRN

## 2020-05-02 NOTE — Assessment & Plan Note (Signed)
No problems with primary prevention with statin

## 2020-05-02 NOTE — Progress Notes (Signed)
Subjective:    Patient ID: Jason Bradley, male    DOB: 1960-06-10, 60 y.o.   MRN: [id number]  HPI Here for physical This visit occurred during the SARS-CoV-2 public health emergency.  Safety protocols were in place, including screening questions prior to the visit, additional usage of staff PPE, and extensive cleaning of exam room while observing appropriate contact time as indicated for disinfecting solutions.   Never filled the sildenafil---things improved Still wants to have it around in case  Allergies are acting up Using flonase and allegra Did have some blood in right nare No respiratory symptoms---just lots of sneezing  Same job  Current Outpatient Medications on File Prior to Visit  Medication Sig Dispense Refill  . atorvastatin (LIPITOR) 20 MG tablet Take 1 tablet (20 mg total) by mouth daily. 90 tablet 3  . Multiple Vitamins-Minerals (MULTIVITAMIN MEN PO) Take 1 tablet by mouth daily.    . timolol (TIMOPTIC) 0.5 % ophthalmic solution Place 1 drop into both eyes daily.    . sildenafil (REVATIO) 20 MG tablet Take 3-5 tablets (60-100 mg total) by mouth daily as needed. (Patient not taking: Reported on 05/02/2020) 50 tablet 11   No current facility-administered medications on file prior to visit.    Allergies  Allergen Reactions  . Bactrim [Sulfamethoxazole-Trimethoprim] Rash  . Shellfish Allergy Swelling    Past Medical History:  Diagnosis Date  . Glaucoma   . Hyperlipemia   . Spinal stenosis of lumbar region with neurogenic claudication     Past Surgical History:  Procedure Laterality Date  . HERNIA REPAIR  1972    Family History  Problem Relation Age of Onset  . Arthritis Mother   . Hypercholesterolemia Mother   . Hypertension Mother   . Diabetes Mother   . Hypertension Sister   . Hearing loss Neg Hx   . Cancer Neg Hx     Social History   Socioeconomic History  . Marital status: Married    Spouse name: Not on file  . Number of children: 3  . Years  of education: Not on file  . Highest education level: Not on file  Occupational History  . Occupation: Air traffic controller  Tobacco Use  . Smoking status: Never Smoker  . Smokeless tobacco: Never Used  Substance and Sexual Activity  . Alcohol use: Yes    Comment: occasional  . Drug use: Not on file  . Sexual activity: Not on file  Other Topics Concern  . Not on file  Social History Narrative  . Not on file   Social Determinants of Health   Financial Resource Strain: Not on file  Food Insecurity: Not on file  Transportation Needs: Not on file  Physical Activity: Not on file  Stress: Not on file  Social Connections: Not on file  Intimate Partner Violence: Not on file   Review of Systems  Constitutional: Negative for fatigue and unexpected weight change.       Tries to walk and lift weights every other day Wears seat belt  HENT: Negative for dental problem, hearing loss and tinnitus.        Keeps up with dentist  Eyes:       Still with diplopia---glasses control it No unilateral vision loss  Respiratory: Negative for cough, chest tightness and shortness of breath.   Cardiovascular: Negative for chest pain, palpitations and leg swelling.  Gastrointestinal: Negative for blood in stool and constipation.  Endocrine: Negative for polydipsia and polyuria.  Genitourinary: Negative for urgency.  Nocturia depends on oral intake  Musculoskeletal: Negative for arthralgias and joint swelling.       Some low back pain recently---lots of bending at work  Skin: Negative for rash.       Has some dark spots on leg for me to check  Allergic/Immunologic: Positive for environmental allergies. Negative for immunocompromised state.  Neurological: Negative for dizziness, syncope and light-headedness.       Occ brief headaches---improve without meds. Lots of caffeine (discussed rebound)  Hematological: Negative for adenopathy. Does not bruise/bleed easily.  Psychiatric/Behavioral:  Negative for dysphoric mood and sleep disturbance. The patient is not nervous/anxious.        Objective:   Physical Exam Constitutional:      Appearance: Normal appearance.  HENT:     Right Ear: Tympanic membrane, ear canal and external ear normal.     Left Ear: Tympanic membrane, ear canal and external ear normal.     Mouth/Throat:     Pharynx: No oropharyngeal exudate or posterior oropharyngeal erythema.  Eyes:     Conjunctiva/sclera: Conjunctivae normal.     Pupils: Pupils are equal, round, and reactive to light.  Cardiovascular:     Rate and Rhythm: Normal rate and regular rhythm.     Pulses: Normal pulses.     Heart sounds: No murmur heard. No gallop.   Pulmonary:     Effort: Pulmonary effort is normal.     Breath sounds: Normal breath sounds. No wheezing or rales.  Abdominal:     Palpations: Abdomen is soft.     Tenderness: There is no abdominal tenderness.  Musculoskeletal:     Cervical back: Neck supple.     Right lower leg: No edema.     Left lower leg: No edema.  Lymphadenopathy:     Cervical: No cervical adenopathy.  Skin:    General: Skin is warm.     Findings: No rash.     Comments: Benign hyperpigmented circular lesions on lateral left lower thigh  Neurological:     General: No focal deficit present.     Mental Status: He is alert and oriented to person, place, and time.  Psychiatric:        Mood and Affect: Mood normal.        Behavior: Behavior normal.            Assessment & Plan:

## 2020-05-02 NOTE — Assessment & Plan Note (Signed)
Healthy Colon due in December Discussed PSA---he wants to recheck Exercises regularly Consider second COVID booster Flu vaccine in the fall He will consider shingrix

## 2020-05-02 NOTE — Assessment & Plan Note (Signed)
Doing okay with OTC meds Consider montelukast if worsens

## 2020-08-30 ENCOUNTER — Telehealth (INDEPENDENT_AMBULATORY_CARE_PROVIDER_SITE_OTHER): Payer: 59 | Admitting: Family Medicine

## 2020-08-30 ENCOUNTER — Other Ambulatory Visit: Payer: Self-pay

## 2020-08-30 ENCOUNTER — Telehealth: Payer: Self-pay | Admitting: *Deleted

## 2020-08-30 DIAGNOSIS — U071 COVID-19: Secondary | ICD-10-CM | POA: Diagnosis not present

## 2020-08-30 NOTE — Telephone Encounter (Signed)
Patient's wife Anderson Malta called the office stating that her husband was exposed to covid at work. Anderson Malta stated that her husband started with a cough, sore throat headache fever and chest tightness Sunday. Patient's wife stated that her husband has been taking tylenol and Theraflu. Patient's wife stated that he tested positive Monday for covid. Patient's wife stated that her husband is not having SOB but just gets winded from the cough. Patient scheduled for a virtual visit today at 5:30 pm with Dr. Elease Hashimoto. Patient's wife was given ER precautions and she verbalized understanding.

## 2020-08-30 NOTE — Progress Notes (Signed)
Patient ID: Jason Bradley, male   DOB: November 05, 1960, 60 y.o.   MRN: [id number]   This visit type was conducted due to national recommendations for restrictions regarding the COVID-19 pandemic in an effort to limit this patient's exposure and mitigate transmission in our community.   Virtual Visit via Telephone Note  I connected with Jason Bradley on 08/30/20 at  5:30 PM EDT by telephone and verified that I am speaking with the correct person using two identifiers.   I discussed the limitations, risks, security and privacy concerns of performing an evaluation and management service by telephone and the availability of in person appointments. I also discussed with the patient that there may be a patient responsible charge related to this service. The patient expressed understanding and agreed to proceed.  Location patient: home Location provider: work or home office Participants present for the call: patient, provider Patient did not have a visit in the prior 7 days to address this/these issue(s).   History of Present Illness:  Jason Bradley has COVID-19.  He apparently developed upper respiratory symptoms last Friday.  He tested positive on Monday.  He relates some nasal congestion, headaches, chills, cough, possible low-grade fever at onset.  All of his symptoms are improving at this time.  He has some residual mild cough but very minimal.  Still has some congestion.  Occasional sweats but no documented fever past couple days.  Generally healthy with no chronic heart or Bradley problems.  He was vaccinated with Moderna vaccine with 1 booster back in December.  Current vital signs reported from patient: Temperature 97.3, pulse oximetry 98%, pulse 98, blood pressure 139/73  Past Medical History:  Diagnosis Date   Glaucoma    Hyperlipemia    Spinal stenosis of lumbar region with neurogenic claudication    Past Surgical History:  Procedure Laterality Date   [place]    reports that he has never  smoked. He has never used smokeless tobacco. He reports current alcohol use. No history on file for drug use. family history includes Arthritis in his mother; Diabetes in his mother; Hypercholesterolemia in his mother; Hypertension in his mother and sister. Allergies  Allergen Reactions   Bactrim [Sulfamethoxazole-Trimethoprim] Rash   Shellfish Allergy Swelling       Observations/Objective: Patient sounds cheerful and well on the phone. I do not appreciate any SOB. Speech and thought processing are grossly intact. Patient reported vitals:  Assessment and Plan:  COVID-19 infection.  He is on day 5 or 6 and symptomatically improving.  Relatively low risk and has been vaccinated.  -We recommend continue plenty of fluids and rest.  We have not recommended antiviral medications given the duration of his symptoms and the fact that he is already improving.  He is comfortable with this plan. -Also reviewed CDC guidelines for isolation  Follow Up Instructions:   [id number] 11-20 [id number] 21-30 I did not refer this patient for an OV in the next 24 hours for this/these issue(s).  I discussed the assessment and treatment plan with the patient. The patient was provided an opportunity to ask questions and all were answered. The patient agreed with the plan and demonstrated an understanding of the instructions.   The patient was advised to call back or seek an in-person evaluation if the symptoms worsen or if the condition fails to improve as anticipated.  I provided 13 minutes of non-face-to-face time during this encounter.   Carolann Littler, MD

## 2020-09-30 ENCOUNTER — Other Ambulatory Visit: Payer: Self-pay | Admitting: Internal Medicine

## 2020-11-03 ENCOUNTER — Other Ambulatory Visit: Payer: Self-pay

## 2020-11-03 ENCOUNTER — Ambulatory Visit (INDEPENDENT_AMBULATORY_CARE_PROVIDER_SITE_OTHER): Payer: 59

## 2020-11-03 DIAGNOSIS — Z23 Encounter for immunization: Secondary | ICD-10-CM

## 2021-03-16 ENCOUNTER — Other Ambulatory Visit: Payer: Self-pay

## 2021-03-16 ENCOUNTER — Ambulatory Visit: Payer: 59 | Admitting: Family

## 2021-03-16 ENCOUNTER — Encounter: Payer: Self-pay | Admitting: Family

## 2021-03-16 ENCOUNTER — Ambulatory Visit
Admission: RE | Admit: 2021-03-16 | Discharge: 2021-03-16 | Disposition: A | Discharge status: Home / Self Care | Payer: 59 | Source: Ambulatory Visit | Attending: Family | Admitting: Family

## 2021-03-16 VITALS — BP 138/84 | HR 61 | Temp 97.7°F | Wt 211.0 lb

## 2021-03-16 DIAGNOSIS — M5442 Lumbago with sciatica, left side: Secondary | ICD-10-CM

## 2021-03-16 DIAGNOSIS — M5441 Lumbago with sciatica, right side: Secondary | ICD-10-CM | POA: Diagnosis not present

## 2021-03-16 MED ORDER — CYCLOBENZAPRINE HCL 10 MG PO TABS: 10.0000 mg | ORAL_TABLET | Freq: Three times a day (TID) | ORAL | 0 refills | Status: DC | PRN

## 2021-03-16 MED ORDER — IBUPROFEN 600 MG PO TABS: 600.0000 mg | ORAL_TABLET | Freq: Three times a day (TID) | ORAL | 0 refills | Status: AC | PRN

## 2021-03-16 NOTE — Progress Notes (Signed)
Established Patient Office Visit  Subjective:  Patient ID: Jason Bradley, male    DOB: 07/12/1960  Age: 61 y.o. MRN: [id number]  CC:  Chief Complaint  Patient presents with   Back Pain    Low Back Pain started 2 days ago. Was bending over doing a lot of work. Next day could barely move. Wearing a back brace.    HPI Jason Bradley is here today with concerns.   Two days ago started with lower back pain after bending over for work the day prior. The first day it started was feeling very stiff and hard to move without pain. He is wearing a back brace which is helping him some however if he moves the wrong way he does have increased pain. He does have radiation of pain down bil legs, right >left. Going down to the lower mid calf. Worse with walking, and especially sitting.   Took some tylenol without much relief at all.   Past Medical History:  Diagnosis Date   Glaucoma    Hyperlipemia    Spinal stenosis of lumbar region with neurogenic claudication     Past Surgical History:  Procedure Laterality Date   HERNIA REPAIR  1972    Family History  Problem Relation Age of Onset   Arthritis Mother    Hypercholesterolemia Mother    Hypertension Mother    Diabetes Mother    Hypertension Sister    Hearing loss Neg Hx    Cancer Neg Hx     Social History   Socioeconomic History   Marital status: Married    Spouse name: Not on file   Number of children: 3   Years of education: Not on file   Highest education level: Not on file  Occupational History   Occupation: Air traffic controller  Tobacco Use   Smoking status: Never   Smokeless tobacco: Never  Substance and Sexual Activity   Alcohol use: Yes    Comment: occasional   Drug use: Not on file   Sexual activity: Not on file  Other Topics Concern   Not on file  Social History Narrative   Not on file   Social Determinants of Health   Financial Resource Strain: Not on file  Food Insecurity: Not on file  Transportation Needs:  Not on file  Physical Activity: Not on file  Stress: Not on file  Social Connections: Not on file  Intimate Partner Violence: Not on file    Outpatient Medications Prior to Visit  Medication Sig Dispense Refill   atorvastatin (LIPITOR) 20 MG tablet TAKE 1 TABLET BY MOUTH EVERY DAY 90 tablet 3   Multiple Vitamins-Minerals (MULTIVITAMIN MEN PO) Take 1 tablet by mouth daily.     sildenafil (REVATIO) 20 MG tablet Take 3-5 tablets (60-100 mg total) by mouth daily as needed. 50 tablet 11   timolol (TIMOPTIC) 0.5 % ophthalmic solution Place 1 drop into both eyes daily.     No facility-administered medications prior to visit.    Allergies  Allergen Reactions   Bactrim [Sulfamethoxazole-Trimethoprim] Rash   Shellfish Allergy Swelling    ROS Review of Systems  Constitutional:  Negative for chills, fatigue and fever.  Cardiovascular:  Negative for chest pain and palpitations.  Musculoskeletal:  Positive for back pain. Gait problem: low back pain with radiation of pain down bil legs posterior on and off limited mobility.    Objective:    Physical Exam Constitutional:      Appearance: Normal appearance. He is normal weight.  Cardiovascular:     Rate and Rhythm: Normal rate and regular rhythm.  Pulmonary:     Effort: Pulmonary effort is normal.  Musculoskeletal:     Lumbar back: Spasms (right mid lower back with tightness) and tenderness (palpated mid lower back see figure one) present. No swelling or edema. Decreased range of motion (very painful ROM all ways).       Back:  Neurological:     Mental Status: He is alert.    BP 138/84 (BP Location: Left Arm, Patient Position: Sitting, Cuff Size: Large)    Pulse 61    Temp 97.7 F (36.5 C)    Wt 211 lb (95.7 kg)    SpO2 98%    BMI 31.16 kg/m  Wt Readings from Last 3 Encounters:  03/16/21 211 lb (95.7 kg)  05/02/20 215 lb (97.5 kg)  04/28/19 215 lb (97.5 kg)     Health Maintenance Due  Topic Date Due   HIV Screening  Never  done   Hepatitis C Screening  Never done   Zoster Vaccines- Shingrix (1 of 2) Never done   COVID-19 Vaccine (4 - Booster for Moderna series) 03/17/2020   COLONOSCOPY (Pts 45-52yrs Insurance coverage will need to be confirmed)  01/09/2021    There are no preventive care reminders to display for this patient.  No results found for: TSH Lab Results  Component Value Date   WBC 3.3 (L) 05/02/2020   HGB 15.7 05/02/2020   HCT 47.1 05/02/2020   MCV 94.4 05/02/2020   PLT 231.0 05/02/2020   Lab Results  Component Value Date   NA 140 05/02/2020   K 4.0 05/02/2020   CO2 30 05/02/2020   GLUCOSE 106 (H) 05/02/2020   BUN 10 05/02/2020   CREATININE 0.99 05/02/2020   BILITOT 0.7 05/02/2020   ALKPHOS 54 05/02/2020   AST 25 05/02/2020   ALT 35 05/02/2020   PROT 7.4 05/02/2020   ALBUMIN 4.4 05/02/2020   CALCIUM 9.8 05/02/2020   GFR 81.97 05/02/2020   No results found for: HGBA1C    Assessment & Plan:   Problem List Items Addressed This Visit       Nervous and Auditory   Acute bilateral low back pain with bilateral sciatica - Primary    Lumbar xray, pending results. rx for flexeril and ibuprofen to pt. Heat/ice alternating. Handout sent to mychart. Pt to call orthopedist to follow up as has h/o chronic sciatica      Relevant Medications   cyclobenzaprine (FLEXERIL) 10 MG tablet   ibuprofen (ADVIL) 600 MG tablet   Other Relevant Orders   DG Lumbar Spine Complete    Meds ordered this encounter  Medications   cyclobenzaprine (FLEXERIL) 10 MG tablet    Sig: Take 1 tablet (10 mg total) by mouth 3 (three) times daily as needed for muscle spasms.    Dispense:  30 tablet    Refill:  0    Order Specific Question:   Supervising Provider    Answer:   BEDSOLE, AMY E [[id number]]   ibuprofen (ADVIL) 600 MG tablet    Sig: Take 1 tablet (600 mg total) by mouth every 8 (eight) hours as needed for up to 10 days for moderate pain.    Dispense:  30 tablet    Refill:  0    Order Specific  Question:   Supervising Provider    Answer:   BEDSOLE, AMY E [[id number]]    Follow-up: Return if symptoms worsen or fail to improve.  Eugenia Pancoast, FNP

## 2021-03-16 NOTE — Assessment & Plan Note (Signed)
Lumbar xray, pending results. rx for flexeril and ibuprofen to pt. Heat/ice alternating. Handout sent to mychart. Pt to call orthopedist to follow up as has h/o chronic sciatica

## 2021-03-16 NOTE — Patient Instructions (Addendum)
You used to see orthopedist, here is the information. Give them a call to get scheduled again.   Jason Jarvis, MD   [place]   [place], [place] [address]   [phone]   [phone] (Fax)    A diagnostic test was ordered for today, and requested to be performed at [facility] outpatient imaging center.  I have sent the order over to the facility for a lower back xray.   Please give this center a call, and schedule your appointment to have this test completed. Once results are received, I will be in touch.   It was a pleasure seeing you today! Please do not hesitate to reach out with any questions and or concerns.  Regards,   Eugenia Pancoast FNP-C

## 2021-04-04 ENCOUNTER — Encounter: Payer: Self-pay | Admitting: Family

## 2021-04-04 ENCOUNTER — Other Ambulatory Visit: Payer: Self-pay

## 2021-04-04 ENCOUNTER — Ambulatory Visit: Payer: 59 | Admitting: Family

## 2021-04-04 VITALS — BP 138/80 | HR 86 | Ht 69.0 in | Wt 218.0 lb

## 2021-04-04 DIAGNOSIS — K130 Diseases of lips: Secondary | ICD-10-CM | POA: Insufficient documentation

## 2021-04-04 DIAGNOSIS — R739 Hyperglycemia, unspecified: Secondary | ICD-10-CM | POA: Insufficient documentation

## 2021-04-04 LAB — CBC
HCT: 44.5 % (ref 39.0–52.0)
Hemoglobin: 14.6 g/dL (ref 13.0–17.0)
MCHC: 32.8 g/dL (ref 30.0–36.0)
MCV: 95 fl (ref 78.0–100.0)
Platelets: 270 10*3/uL (ref 150.0–400.0)
RBC: 4.68 Mil/uL (ref 4.22–5.81)
RDW: 13.3 % (ref 11.5–15.5)
WBC: 3.8 10*3/uL — ABNORMAL LOW (ref 4.0–10.5)

## 2021-04-04 LAB — HEMOGLOBIN A1C: Hgb A1c MFr Bld: 6.3 % (ref 4.6–6.5)

## 2021-04-04 LAB — B12 AND FOLATE PANEL
Folate: 19.6 ng/mL (ref >5.9)
Vitamin B-12: 530 pg/mL (ref 211–911)

## 2021-04-04 MED ORDER — MUPIROCIN 2 % EX OINT: 1.0000 "application " | TOPICAL_OINTMENT | Freq: Two times a day (BID) | CUTANEOUS | 0 refills | Status: AC

## 2021-04-04 NOTE — Progress Notes (Signed)
? ?Established Patient Office Visit ? ?Subjective:  ?Patient ID: Jason Bradley, male    DOB: May 12, 1960  Age: 61 y.o. MRN: [id number] ? ?CC:  ?Chief Complaint  ?Patient presents with  ? Lip Laceration  ?  Pt stated-lip have discoloration, itching, swollen--7 days. Tried neosporin.  ? ? ?HPI ?Jason Bradley is here today with concerns.  ? ?Started about one week ago with lip swelling, chapping, and skin peeling off. Not necessarily painful but irritated, itching here and there. Hard to move lips without pain because they will crack, has tried neosporin with no relief.  ? ?No new medications.  ?Did take flexeril prn and ibuprofen over two weeks ago, not since.  ? ?Past Medical History:  ?Diagnosis Date  ? Glaucoma   ? Hyperlipemia   ? Spinal stenosis of lumbar region with neurogenic claudication   ? ? ?Past Surgical History:  ?Procedure Laterality Date  ? [place]  ? ? ?Family History  ?Problem Relation Age of Onset  ? Arthritis Mother   ? Hypercholesterolemia Mother   ? Hypertension Mother   ? Diabetes Mother   ? Hypertension Sister   ? Hearing loss Neg Hx   ? Cancer Neg Hx   ? ? ?Social History  ? ?Socioeconomic History  ? Marital status: Married  ?  Spouse name: Not on file  ? Number of children: 3  ? Years of education: Not on file  ? Highest education level: Not on file  ?Occupational History  ? Occupation: Air traffic controller  ?Tobacco Use  ? Smoking status: Never  ? Smokeless tobacco: Never  ?Substance and Sexual Activity  ? Alcohol use: Yes  ?  Comment: occasional  ? Drug use: Not on file  ? Sexual activity: Not on file  ?Other Topics Concern  ? Not on file  ?Social History Narrative  ? Not on file  ? ?Social Determinants of Health  ? ?Financial Resource Strain: Not on file  ?Food Insecurity: Not on file  ?Transportation Needs: Not on file  ?Physical Activity: Not on file  ?Stress: Not on file  ?Social Connections: Not on file  ?Intimate Partner Violence: Not on file  ? ? ?Outpatient Medications Prior to  Visit  ?Medication Sig Dispense Refill  ? atorvastatin (LIPITOR) 20 MG tablet TAKE 1 TABLET BY MOUTH EVERY DAY 90 tablet 3  ? cyclobenzaprine (FLEXERIL) 10 MG tablet Take 1 tablet (10 mg total) by mouth 3 (three) times daily as needed for muscle spasms. 30 tablet 0  ? Multiple Vitamins-Minerals (MULTIVITAMIN MEN PO) Take 1 tablet by mouth daily.    ? sildenafil (REVATIO) 20 MG tablet Take 3-5 tablets (60-100 mg total) by mouth daily as needed. 50 tablet 11  ? timolol (TIMOPTIC) 0.5 % ophthalmic solution Place 1 drop into both eyes daily.    ? ?No facility-administered medications prior to visit.  ? ? ?Allergies  ?Allergen Reactions  ? Bactrim [Sulfamethoxazole-Trimethoprim] Rash  ? Shellfish Allergy Swelling  ? ? ?ROS ?Review of Systems  ?Constitutional:  Negative for chills and fever.  ?HENT:  Negative for congestion, ear pain, sinus pain and sore throat.   ?Respiratory:  Negative for cough, shortness of breath and wheezing.   ?Cardiovascular:  Negative for chest pain and palpitations.  ?Skin:  Positive for rash (lip rash, itchy/chapped discomfort with redness).  ?     Lips did swell last week per pt has since resolved  ? ?  ?Objective:  ?  ?Physical Exam ?Constitutional:   ?  General: He is not in acute distress. ?   Appearance: Normal appearance. He is normal weight. He is not ill-appearing, toxic-appearing or diaphoretic.  ?HENT:  ?   Mouth/Throat:  ?   Lips: Lesions (cracking with small fissures bil corners mouth with erythema edging upper and lower lips) present.  ?   Dentition: Dental caries present. No dental abscesses or gum lesions.  ?   Tongue: No lesions.  ?   Pharynx: Oropharynx is clear. No pharyngeal swelling or posterior oropharyngeal erythema.  ?Cardiovascular:  ?   Rate and Rhythm: Normal rate and regular rhythm.  ?Pulmonary:  ?   Effort: Pulmonary effort is normal.  ?Neurological:  ?   Mental Status: He is alert.  ? ? ?BP 138/80   Pulse 86   Ht 5\' 9"  (1.753 m)   Wt 218 lb (98.9 kg)   SpO2 97%    BMI 32.19 kg/m?  ?Wt Readings from Last 3 Encounters:  ?04/04/21 218 lb (98.9 kg)  ?03/16/21 211 lb (95.7 kg)  ?05/02/20 215 lb (97.5 kg)  ? ? ? ?Health Maintenance Due  ?Topic Date Due  ? HIV Screening  Never done  ? Hepatitis C Screening  Never done  ? Zoster Vaccines- Shingrix (1 of 2) Never done  ? COVID-19 Vaccine (4 - Booster for Moderna series) 03/17/2020  ? COLONOSCOPY (Pts 45-33yrs Insurance coverage will need to be confirmed)  01/09/2021  ? ? ?There are no preventive care reminders to display for this patient. ? ?No results found for: TSH ?Lab Results  ?Component Value Date  ? WBC 3.3 (L) 05/02/2020  ? HGB 15.7 05/02/2020  ? HCT 47.1 05/02/2020  ? MCV 94.4 05/02/2020  ? PLT 231.0 05/02/2020  ? ?Lab Results  ?Component Value Date  ? NA 140 05/02/2020  ? K 4.0 05/02/2020  ? CO2 30 05/02/2020  ? GLUCOSE 106 (H) 05/02/2020  ? BUN 10 05/02/2020  ? CREATININE 0.99 05/02/2020  ? BILITOT 0.7 05/02/2020  ? ALKPHOS 54 05/02/2020  ? AST 25 05/02/2020  ? ALT 35 05/02/2020  ? PROT 7.4 05/02/2020  ? ALBUMIN 4.4 05/02/2020  ? CALCIUM 9.8 05/02/2020  ? GFR 81.97 05/02/2020  ? ?No results found for: HGBA1C ? ?  ?Assessment & Plan:  ? ?Problem List Items Addressed This Visit   ? ?  ? Digestive  ? Angular cheilitis - Primary  ?  Suspected angular this ?Lab work today to include B12 CBC A1c and BMP suspected staph versus fungal will start with mupirocin ointment prescription sent to the pharmacy patient to apply twice daily if no improvement in the next 2 to 3 days we will have patient change to over-the-counter clotrimazole.  Patient does admit to chewing on fingernails advised him to stop that this could Also contributed.  Advised to apply Vaseline and/or Aquaphor at night.  If there is no improvement will have to refer to dermatology ? ?  ?  ? Relevant Medications  ? mupirocin ointment (BACTROBAN) 2 %  ? Other Relevant Orders  ? Hemoglobin A1c  ? B12 and Folate Panel  ? CBC  ? Anaerobic and Aerobic Culture  ?  ? Other   ? Hyperglycemia  ?  A1c ordered pending results ?  ?  ? Relevant Orders  ? Hemoglobin A1c  ? ? ?Meds ordered this encounter  ?Medications  ? mupirocin ointment (BACTROBAN) 2 %  ?  Sig: Apply 1 application topically 2 (two) times daily for 14 days.  ?  Dispense:  28 g  ?  Refill:  0  ?  Order Specific Question:   Supervising Provider  ?  Answer:   BEDSOLE, AMY E [[id number]]  ? ? ?Follow-up: Return if symptoms worsen or fail to improve.  ? ? ?Eugenia Pancoast, FNP ?

## 2021-04-04 NOTE — Assessment & Plan Note (Signed)
Suspected angular this ?Lab work today to include B12 CBC A1c and BMP suspected staph versus fungal will start with mupirocin ointment prescription sent to the pharmacy patient to apply twice daily if no improvement in the next 2 to 3 days we will have patient change to over-the-counter clotrimazole.  Patient does admit to chewing on fingernails advised him to stop that this could Also contributed.  Advised to apply Vaseline and/or Aquaphor at night.  If there is no improvement will have to refer to dermatology ? ?

## 2021-04-04 NOTE — Assessment & Plan Note (Signed)
A1c ordered pending results ?

## 2021-04-04 NOTE — Patient Instructions (Signed)
Apply mupirocin ointment to lips twice daily for hte next one to two weeks. If no improvement in the next few days start with over the counter clotrimazole (lotrimin) cream twice daily. At night time apply either vaseline or aquaphor. We will obtain lab work today as well to see if any deficiencies.  ? ?It was a pleasure seeing you today! Please do not hesitate to reach out with any questions and or concerns. ? ?Regards,  ? ?Euan Wandler ?FNP-C ? ?

## 2021-04-05 NOTE — Progress Notes (Signed)
Pt is prediabetic, work on [employer] exercise as tolerated ?No folate or b12 def ?White blood cell chronically low? Has pt ever had workup?

## 2021-04-06 ENCOUNTER — Other Ambulatory Visit: Payer: Self-pay | Admitting: Family

## 2021-04-06 DIAGNOSIS — D72819 Decreased white blood cell count, unspecified: Secondary | ICD-10-CM

## 2021-04-06 NOTE — Progress Notes (Signed)
B

## 2021-04-06 NOTE — Progress Notes (Signed)
Please let patient know that this is typically usually and I think.  But to rule out since patient has been experiencing this chronically I will refer to hematology for a general work-up.  If patient does not hear about the referral in 1 to 2 weeks please let us know

## 2021-04-09 ENCOUNTER — Other Ambulatory Visit: Payer: Self-pay | Admitting: Family

## 2021-04-09 DIAGNOSIS — K130 Diseases of lips: Secondary | ICD-10-CM

## 2021-04-09 MED ORDER — DOXYCYCLINE HYCLATE 100 MG PO TABS: 100.0000 mg | ORAL_TABLET | Freq: Two times a day (BID) | ORAL | 0 refills | Status: AC

## 2021-04-09 NOTE — Progress Notes (Signed)
Please let pt know that he has bacterial growth on his culture I sent out. I am sending him in antbx doxycycline 100 mg one po bid x 10 days have pt start with this.

## 2021-04-11 LAB — ANAEROBIC AND AEROBIC CULTURE
MICRO NUMBER:: 13073987
MICRO NUMBER:: 13073988
SPECIMEN QUALITY:: ADEQUATE
SPECIMEN QUALITY:: ADEQUATE

## 2021-04-12 ENCOUNTER — Telehealth: Payer: Self-pay | Admitting: Internal Medicine

## 2021-04-12 NOTE — Telephone Encounter (Signed)
Jason Pancoast, FNP ?to Jason Bradley, [place], CMA  Brenton Grills, CMA   ?  58:68 PM ?Please let pt know that during the visit I had advised I would be referring him to dermatology just in case he did not have improvement with his lip rash. If it is better then he does not need to see dermatology.  ? ?Also, for hematology I had asked him about his low white blood cells which he stated has been for a long time so I referred him to blood specialist just to do a workup to make sure this is not a concern, which I am sure is not.  ?

## 2021-04-12 NOTE — Telephone Encounter (Signed)
Pt notified as instructed and pt voiced understanding; pt said that lip rash is better and pt said to hold off on dermatology referral and if pt condition changes or worsens pt will cb for referral. Pt said no one talked with him about low white blood cells and I reviewed the info again and pt said ok and pt said someone called him and told him he had an appt on 04/23/21 at 11:15 but pt did not know where to go. I told pt appt was with Dr Tasia Catchings and offered to give address info and pt said just to give him phone # and he would call and get information himself. I gave pt Dr Collie Siad office # [phone]. Sending note to Eugenia Pancoast FNP as Juluis Rainier. ?

## 2021-04-12 NOTE — Telephone Encounter (Signed)
Pt called in wants to know why he was referred to HEMATOLOGY / ONCOLOGY // also dermatology would like a call back . To get clarity. Please advise [phone] ?  ?

## 2021-04-20 ENCOUNTER — Encounter: Payer: Self-pay | Admitting: *Deleted

## 2021-04-20 NOTE — Progress Notes (Signed)
Patient contacted for NP visit. Chart reviewed and medication list updated. Pt aware of referral and appt date/time. All questions answered.  ?

## 2021-04-23 ENCOUNTER — Other Ambulatory Visit: Payer: Self-pay

## 2021-04-23 ENCOUNTER — Encounter (INDEPENDENT_AMBULATORY_CARE_PROVIDER_SITE_OTHER): Payer: Self-pay

## 2021-04-23 ENCOUNTER — Inpatient Hospital Stay: Payer: 59

## 2021-04-23 ENCOUNTER — Inpatient Hospital Stay: Payer: 59 | Attending: Oncology | Admitting: Oncology

## 2021-04-23 VITALS — BP 176/96 | HR 65 | Temp 98.7°F | Resp 20 | Wt 210.1 lb

## 2021-04-23 DIAGNOSIS — Z789 Other specified health status: Secondary | ICD-10-CM

## 2021-04-23 DIAGNOSIS — H409 Unspecified glaucoma: Secondary | ICD-10-CM | POA: Diagnosis not present

## 2021-04-23 DIAGNOSIS — F1090 Alcohol use, unspecified, uncomplicated: Secondary | ICD-10-CM | POA: Insufficient documentation

## 2021-04-23 DIAGNOSIS — D72819 Decreased white blood cell count, unspecified: Secondary | ICD-10-CM | POA: Insufficient documentation

## 2021-04-23 DIAGNOSIS — E785 Hyperlipidemia, unspecified: Secondary | ICD-10-CM | POA: Diagnosis not present

## 2021-04-23 LAB — CBC WITH DIFFERENTIAL/PLATELET
Abs Immature Granulocytes: 0 10*3/uL (ref 0.00–0.07)
Basophils Absolute: 0 10*3/uL (ref 0.0–0.1)
Basophils Relative: 1 %
Eosinophils Absolute: 0.1 10*3/uL (ref 0.0–0.5)
Eosinophils Relative: 3 %
HCT: 46.6 % (ref 39.0–52.0)
Hemoglobin: 15.3 g/dL (ref 13.0–17.0)
Immature Granulocytes: 0 %
Lymphocytes Relative: 26 %
Lymphs Abs: 0.9 10*3/uL (ref 0.7–4.0)
MCH: 30.9 pg (ref 26.0–34.0)
MCHC: 32.8 g/dL (ref 30.0–36.0)
MCV: 94.1 fL (ref 80.0–100.0)
Monocytes Absolute: 0.4 10*3/uL (ref 0.1–1.0)
Monocytes Relative: 11 %
Neutro Abs: 2.1 10*3/uL (ref 1.7–7.7)
Neutrophils Relative %: 59 %
Platelets: 277 10*3/uL (ref 150–400)
RBC: 4.95 MIL/uL (ref 4.22–5.81)
RDW: 13.1 % (ref 11.5–15.5)
WBC: 3.6 10*3/uL — ABNORMAL LOW (ref 4.0–10.5)
nRBC: 0 % (ref 0.0–0.2)

## 2021-04-23 LAB — COMPREHENSIVE METABOLIC PANEL
ALT: 41 U/L (ref 0–44)
AST: 31 U/L (ref 15–41)
Albumin: 4 g/dL (ref 3.5–5.0)
Alkaline Phosphatase: 54 U/L (ref 38–126)
Anion gap: 9 (ref 5–15)
BUN: 12 mg/dL (ref 8–23)
CO2: 27 mmol/L (ref 22–32)
Calcium: 9.3 mg/dL (ref 8.9–10.3)
Chloride: 101 mmol/L (ref 98–111)
Creatinine, Ser: 0.97 mg/dL (ref 0.61–1.24)
GFR, Estimated: >60 mL/min (ref >60)
Glucose, Bld: 105 mg/dL — ABNORMAL HIGH (ref 70–99)
Potassium: 3.9 mmol/L (ref 3.5–5.1)
Sodium: 137 mmol/L (ref 135–145)
Total Bilirubin: 0.3 mg/dL (ref 0.3–1.2)
Total Protein: 8 g/dL (ref 6.5–8.1)

## 2021-04-23 LAB — HEPATITIS PANEL, ACUTE
HCV Ab: NONREACTIVE
Hep A IgM: NONREACTIVE
Hep B C IgM: NONREACTIVE
Hepatitis B Surface Ag: NONREACTIVE

## 2021-04-23 LAB — HIV ANTIBODY (ROUTINE TESTING W REFLEX): HIV Screen 4th Generation wRfx: NONREACTIVE

## 2021-04-23 LAB — TSH: TSH: 1.55 u[IU]/mL (ref 0.350–4.500)

## 2021-04-23 LAB — T4, FREE: Free T4: 0.93 ng/dL (ref 0.61–1.12)

## 2021-04-23 LAB — LACTATE DEHYDROGENASE: LDH: 145 U/L (ref 98–192)

## 2021-04-23 NOTE — Progress Notes (Signed)
?Hematology/Oncology Consult note ? ?[phone] F ? ? ?Patient Care Team: ?Venia Carbon, MD as PCP - General (Internal Medicine) ?Earlie Server, MD as Consulting Physician (Hematology) ? ?REFERRING PROVIDER: ?Eugenia Pancoast, FNP ? ?CHIEF COMPLAINTS/REASON FOR VISIT:  ?Evaluation of leukopenia ? ?HISTORY OF PRESENTING ILLNESS:  ?Jason Bradley is a 61 y.o. male who was seen in consultation at the request of Eugenia Pancoast, FNP for evaluation of leukopenia. ? ? ?Reviewed patient's recent labs. ?04/04/2021 patient has low total WBC count was 3.8, differential was not obtained. ?Previous lab records reviewed. Leukopenia duration is chronic onset, duration is since at least 2018. ?04/24/18 18, CBC showed total white count of 3.5, neutrophil 40%, ANC 1.4. ? ?Associated symptoms:  denies fatigue, weight loss,frequent infection.  ?Wife reports that patient had occasional episodes of feeling warm, and some chills. ?History hepatitis or HIV infection: Denies ?History of chronic liver disease: Denies ?Alcohol consumption: 6 to 7 cans of beer per week. ?Herbal medication: Denies ?Patient drinks tonic water with quinine for relief of lower extremity cramps secondary to statin. ? ? ? ?Review of Systems  ?Constitutional:  Negative for appetite change, chills, fatigue, fever and unexpected weight change.  ?HENT:   Negative for hearing loss and voice change.   ?Eyes:  Negative for eye problems and icterus.  ?Respiratory:  Negative for chest tightness, cough and shortness of breath.   ?Cardiovascular:  Negative for chest pain and leg swelling.  ?Gastrointestinal:  Negative for abdominal distention and abdominal pain.  ?Endocrine: Negative for hot flashes.  ?Genitourinary:  Negative for difficulty urinating, dysuria and frequency.   ?Musculoskeletal:  Negative for arthralgias.  ?Skin:  Negative for itching and rash.  ?Neurological:  Negative for light-headedness and numbness.  ?Hematological:  Negative for adenopathy. Does not  bruise/bleed easily.  ?Psychiatric/Behavioral:  Negative for confusion.   ? ?MEDICAL HISTORY:  ?Past Medical History:  ?Diagnosis Date  ? Glaucoma   ? Hyperglycemia   ? Hyperlipemia   ? Leukopenia   ? Spinal stenosis of lumbar region with neurogenic claudication   ? ? ?SURGICAL HISTORY: ?Past Surgical History:  ?Procedure Laterality Date  ? [place]  ? ? ?SOCIAL HISTORY: ?Social History  ? ?Socioeconomic History  ? Marital status: Married  ?  Spouse name: Not on file  ? Number of children: 3  ? Years of education: Not on file  ? Highest education level: Not on file  ?Occupational History  ? Occupation: Air traffic controller  ?Tobacco Use  ? Smoking status: Never  ? Smokeless tobacco: Never  ?Vaping Use  ? Vaping Use: Never used  ?Substance and Sexual Activity  ? Alcohol use: Yes  ?  Comment: occasional  ? Drug use: Never  ? Sexual activity: Not on file  ?Other Topics Concern  ? Not on file  ?Social History Narrative  ? Not on file  ? ?Social Determinants of Health  ? ?Financial Resource Strain: Not on file  ?Food Insecurity: Not on file  ?Transportation Needs: Not on file  ?Physical Activity: Not on file  ?Stress: Not on file  ?Social Connections: Not on file  ?Intimate Partner Violence: Not on file  ? ? ?FAMILY HISTORY: ?Family History  ?Problem Relation Age of Onset  ? Arthritis Mother   ? Hypercholesterolemia Mother   ? Hypertension Mother   ? Diabetes Mother   ? Hypertension Sister   ? Brain cancer Maternal Grandmother   ? Hearing loss Neg Hx   ? Cancer Neg Hx   ? ? ?  ALLERGIES:  is allergic to bactrim [sulfamethoxazole-trimethoprim] and shellfish allergy. ? ?MEDICATIONS:  ?Current Outpatient Medications  ?Medication Sig Dispense Refill  ? atorvastatin (LIPITOR) 20 MG tablet TAKE 1 TABLET BY MOUTH EVERY DAY 90 tablet 3  ? cyclobenzaprine (FLEXERIL) 10 MG tablet Take 1 tablet (10 mg total) by mouth 3 (three) times daily as needed for muscle spasms. 30 tablet 0  ? Multiple Vitamins-Minerals  (MULTIVITAMIN MEN PO) Take 1 tablet by mouth daily.    ? sildenafil (REVATIO) 20 MG tablet Take 3-5 tablets (60-100 mg total) by mouth daily as needed. 50 tablet 11  ? timolol (TIMOPTIC) 0.5 % ophthalmic solution Place 1 drop into both eyes daily.    ? ?No current facility-administered medications for this visit.  ? ? ? ?PHYSICAL EXAMINATION: ?ECOG PERFORMANCE STATUS: 0 - Asymptomatic ?Vitals:  ? 04/23/21 1114  ?BP: (!) 176/96  ?Pulse: 65  ?Resp: 20  ?Temp: 98.7 ?F (37.1 ?C)  ?SpO2: 100%  ? ?Filed Weights  ? 04/23/21 1114  ?Weight: 210 lb 1.6 oz (95.3 kg)  ? ? ?Physical Exam ?Constitutional:   ?   General: He is not in acute distress. ?HENT:  ?   Head: Normocephalic and atraumatic.  ?Eyes:  ?   General: No scleral icterus. ?Cardiovascular:  ?   Rate and Rhythm: Normal rate and regular rhythm.  ?   Heart sounds: Normal heart sounds.  ?Pulmonary:  ?   Effort: Pulmonary effort is normal. No respiratory distress.  ?   Breath sounds: No wheezing.  ?Abdominal:  ?   General: Bowel sounds are normal. There is no distension.  ?   Palpations: Abdomen is soft.  ?Musculoskeletal:     ?   General: No deformity. Normal range of motion.  ?   Cervical back: Normal range of motion and neck supple.  ?Skin: ?   General: Skin is warm and dry.  ?   Findings: No erythema or rash.  ?Neurological:  ?   Mental Status: He is alert and oriented to person, place, and time. Mental status is at baseline.  ?   Cranial Nerves: No cranial nerve deficit.  ?   Coordination: Coordination normal.  ?Psychiatric:     ?   Mood and Affect: Mood normal.  ? ? ?RADIOGRAPHIC STUDIES: ?I have personally reviewed the radiological images as listed and agreed with the findings in the report.  ? ? ? ?LABORATORY DATA:  ?I have reviewed the data as listed ?Lab Results  ?Component Value Date  ? WBC 3.6 (L) 04/23/2021  ? HGB 15.3 04/23/2021  ? HCT 46.6 04/23/2021  ? MCV 94.1 04/23/2021  ? PLT 277 04/23/2021  ? ?Recent Labs  ?  05/02/20 ?0822 04/23/21 ?1144  ?NA 140 137   ?K 4.0 3.9  ?CL 103 101  ?CO2 30 27  ?GLUCOSE 106* 105*  ?BUN 10 12  ?CREATININE 0.99 0.97  ?CALCIUM 9.8 9.3  ?GFRNONAA  --  >60  ?PROT 7.4 8.0  ?ALBUMIN 4.4 4.0  ?AST 25 31  ?ALT 35 41  ?ALKPHOS 54 54  ?BILITOT 0.7 0.3  ? ?Iron/TIBC/Ferritin/ %Sat ?No results found for: IRON, TIBC, FERRITIN, IRONPCTSAT  ? ?RADIOGRAPHIC STUDIES: ?I have personally reviewed the radiological images as listed and agreed with the findings in the report. ?No results found.  ? ? ?ASSESSMENT & PLAN:  ?1. Leukopenia, unspecified type   ?2. Alcohol use   ? ? ?I discussed with patient that the differential diagnosis of the leukopenia broad, including infection, inflammation,  ethnic neutropenia, nutrition deficiency, drug-induced, malignant etiology including underlying bone morrow disorders.  ? Recommend to check CBC, smear, hepatitis panel, HIV, flow cytometry, multiple myeloma panel, LDH.  Patient has had normal vitamin B12 and folate level, t-obtained by primary care provider.  I will check methylmalonic acid. ?Check thyroid function. ? ?Chronic consumption of tonic water with quinine.  This could cause thrombocytopenia.  Less commonly, it can be associated with neutropenia.  Discussed with patient recommend temporarily holding  ?consumption of tonic water with quinine.   ?Chronic alcohol use, moderate.  Recommend alcohol cessation. ? ?# Patient follow-up with me in approximately 4 weeks to review the above results.  ? ?Orders Placed This Encounter  ?Procedures  ? TSH  ?  Standing Status:   Future  ?  Number of Occurrences:   1  ?  Standing Expiration Date:   04/24/2022  ? Comprehensive metabolic panel  ?  Standing Status:   Future  ?  Number of Occurrences:   1  ?  Standing Expiration Date:   04/24/2022  ? CBC with Differential/Platelet  ?  Standing Status:   Future  ?  Number of Occurrences:   1  ?  Standing Expiration Date:   04/24/2022  ? Technologist smear review  ?  Standing Status:   Future  ?  Standing Expiration Date:   04/24/2022  ?  Multiple Myeloma Panel (SPEP&IFE w/QIG)  ?  Standing Status:   Future  ?  Number of Occurrences:   1  ?  Standing Expiration Date:   04/24/2022  ? Kappa/lambda light chains  ?  Standing Status:   Future  ?  Numb

## 2021-04-24 LAB — KAPPA/LAMBDA LIGHT CHAINS
Kappa free light chain: 26.1 mg/L — ABNORMAL HIGH (ref 3.3–19.4)
Kappa, lambda light chain ratio: 1.47 (ref 0.26–1.65)
Lambda free light chains: 17.8 mg/L (ref 5.7–26.3)

## 2021-04-25 LAB — COMP PANEL: LEUKEMIA/LYMPHOMA

## 2021-04-26 LAB — METHYLMALONIC ACID, SERUM: Methylmalonic Acid, Quantitative: 108 nmol/L (ref 0–378)

## 2021-04-27 LAB — MULTIPLE MYELOMA PANEL, SERUM
Albumin SerPl Elph-Mcnc: 4 g/dL (ref 2.9–4.4)
Albumin/Glob SerPl: 1.2 (ref 0.7–1.7)
Alpha 1: 0.2 g/dL (ref 0.0–0.4)
Alpha2 Glob SerPl Elph-Mcnc: 0.6 g/dL (ref 0.4–1.0)
B-Globulin SerPl Elph-Mcnc: 1.2 g/dL (ref 0.7–1.3)
Gamma Glob SerPl Elph-Mcnc: 1.4 g/dL (ref 0.4–1.8)
Globulin, Total: 3.4 g/dL (ref 2.2–3.9)
IgA: 399 mg/dL (ref 61–437)
IgG (Immunoglobin G), Serum: 1624 mg/dL — ABNORMAL HIGH (ref 603–1613)
IgM (Immunoglobulin M), Srm: 99 mg/dL (ref 20–172)
Total Protein ELP: 7.4 g/dL (ref 6.0–8.5)

## 2021-05-08 ENCOUNTER — Encounter: Payer: 59 | Admitting: Internal Medicine

## 2021-05-24 ENCOUNTER — Encounter: Payer: Self-pay | Admitting: Oncology

## 2021-05-24 ENCOUNTER — Inpatient Hospital Stay: Payer: 59 | Attending: Oncology | Admitting: Oncology

## 2021-05-24 VITALS — BP 161/88 | HR 58 | Temp 97.7°F | Resp 18 | Wt 209.9 lb

## 2021-05-24 DIAGNOSIS — D72819 Decreased white blood cell count, unspecified: Secondary | ICD-10-CM | POA: Diagnosis present

## 2021-05-24 DIAGNOSIS — F1090 Alcohol use, unspecified, uncomplicated: Secondary | ICD-10-CM | POA: Diagnosis not present

## 2021-05-24 DIAGNOSIS — Z789 Other specified health status: Secondary | ICD-10-CM | POA: Diagnosis not present

## 2021-05-24 NOTE — Progress Notes (Signed)
?Hematology/Oncology Consult note ? ?[phone] F ? ? ?Patient Care Team: ?Venia Carbon, MD as PCP - General (Internal Medicine) ?Earlie Server, MD as Consulting Physician (Hematology) ? ?REFERRING PROVIDER: ?Venia Carbon, MD ? ?CHIEF COMPLAINTS/REASON FOR VISIT:  ?Evaluation of leukopenia ? ?HISTORY OF PRESENTING ILLNESS:  ?Jason Bradley is a 61 y.o. male who was seen in consultation at the request of Venia Carbon, MD for evaluation of leukopenia. ? ? ?Reviewed patient's recent labs. ?04/04/2021 patient has low total WBC count was 3.8, differential was not obtained. ?Previous lab records reviewed. Leukopenia duration is chronic onset, duration is since at least 2018. ?04/24/18 18, CBC showed total white count of 3.5, neutrophil 40%, ANC 1.4. ? ?Associated symptoms:  denies fatigue, weight loss,frequent infection.  ?Wife reports that patient had occasional episodes of feeling warm, and some chills. ?History hepatitis or HIV infection: Denies ?History of chronic liver disease: Denies ?Alcohol consumption: 6 to 7 cans of beer per week. ?Herbal medication: Denies ?Patient drinks tonic water with quinine for relief of lower extremity cramps secondary to statin. ? ?INTERVAL HISTORY ?Jason Bradley is a 61 y.o. male who has above history reviewed by me today presents to review results ?Chronic back issue.  He has no new complaints. ? ? ?Review of Systems  ?Constitutional:  Negative for appetite change, chills, fatigue, fever and unexpected weight change.  ?HENT:   Negative for hearing loss and voice change.   ?Eyes:  Negative for eye problems and icterus.  ?Respiratory:  Negative for chest tightness, cough and shortness of breath.   ?Cardiovascular:  Negative for chest pain and leg swelling.  ?Gastrointestinal:  Negative for abdominal distention and abdominal pain.  ?Endocrine: Negative for hot flashes.  ?Genitourinary:  Negative for difficulty urinating, dysuria and frequency.   ?Musculoskeletal:  Negative  for arthralgias.  ?Skin:  Negative for itching and rash.  ?Neurological:  Negative for light-headedness and numbness.  ?Hematological:  Negative for adenopathy. Does not bruise/bleed easily.  ?Psychiatric/Behavioral:  Negative for confusion.   ? ?MEDICAL HISTORY:  ?Past Medical History:  ?Diagnosis Date  ? Glaucoma   ? Hyperglycemia   ? Hyperlipemia   ? Leukopenia   ? Spinal stenosis of lumbar region with neurogenic claudication   ? ? ?SURGICAL HISTORY: ?Past Surgical History:  ?Procedure Laterality Date  ? [place]  ? ? ?SOCIAL HISTORY: ?Social History  ? ?Socioeconomic History  ? Marital status: Married  ?  Spouse name: Not on file  ? Number of children: 3  ? Years of education: Not on file  ? Highest education level: Not on file  ?Occupational History  ? Occupation: Air traffic controller  ?Tobacco Use  ? Smoking status: Never  ? Smokeless tobacco: Never  ?Vaping Use  ? Vaping Use: Never used  ?Substance and Sexual Activity  ? Alcohol use: Yes  ?  Comment: occasional  ? Drug use: Never  ? Sexual activity: Not on file  ?Other Topics Concern  ? Not on file  ?Social History Narrative  ? Not on file  ? ?Social Determinants of Health  ? ?Financial Resource Strain: Not on file  ?Food Insecurity: Not on file  ?Transportation Needs: Not on file  ?Physical Activity: Not on file  ?Stress: Not on file  ?Social Connections: Not on file  ?Intimate Partner Violence: Not on file  ? ? ?FAMILY HISTORY: ?Family History  ?Problem Relation Age of Onset  ? Arthritis Mother   ? Hypercholesterolemia Mother   ? Hypertension Mother   ?  Diabetes Mother   ? Hypertension Sister   ? Brain cancer Maternal Grandmother   ? Hearing loss Neg Hx   ? Cancer Neg Hx   ? ? ?ALLERGIES:  is allergic to bactrim [sulfamethoxazole-trimethoprim] and shellfish allergy. ? ?MEDICATIONS:  ?Current Outpatient Medications  ?Medication Sig Dispense Refill  ? atorvastatin (LIPITOR) 20 MG tablet TAKE 1 TABLET BY MOUTH EVERY DAY 90 tablet 3  ?  cyclobenzaprine (FLEXERIL) 10 MG tablet Take 1 tablet (10 mg total) by mouth 3 (three) times daily as needed for muscle spasms. 30 tablet 0  ? Multiple Vitamins-Minerals (MULTIVITAMIN MEN PO) Take 1 tablet by mouth daily.    ? sildenafil (REVATIO) 20 MG tablet Take 3-5 tablets (60-100 mg total) by mouth daily as needed. 50 tablet 11  ? timolol (TIMOPTIC) 0.5 % ophthalmic solution Place 1 drop into both eyes daily.    ? ?No current facility-administered medications for this visit.  ? ? ? ?PHYSICAL EXAMINATION: ?ECOG PERFORMANCE STATUS: 0 - Asymptomatic ?Vitals:  ? 05/24/21 0953  ?BP: (!) 161/88  ?Pulse: (!) 58  ?Resp: 18  ?Temp: 97.7 ?F (36.5 ?C)  ? ?Filed Weights  ? 05/24/21 0953  ?Weight: 209 lb 14.4 oz (95.2 kg)  ? ? ?Physical Exam ?Constitutional:   ?   General: He is not in acute distress. ?HENT:  ?   Head: Normocephalic and atraumatic.  ?Eyes:  ?   General: No scleral icterus. ?Cardiovascular:  ?   Rate and Rhythm: Normal rate and regular rhythm.  ?   Heart sounds: Normal heart sounds.  ?Pulmonary:  ?   Effort: Pulmonary effort is normal. No respiratory distress.  ?   Breath sounds: No wheezing.  ?Abdominal:  ?   General: Bowel sounds are normal. There is no distension.  ?   Palpations: Abdomen is soft.  ?Musculoskeletal:     ?   General: No deformity. Normal range of motion.  ?   Cervical back: Normal range of motion and neck supple.  ?Skin: ?   General: Skin is warm and dry.  ?   Findings: No erythema or rash.  ?Neurological:  ?   Mental Status: He is alert and oriented to person, place, and time. Mental status is at baseline.  ?   Cranial Nerves: No cranial nerve deficit.  ?   Coordination: Coordination normal.  ?Psychiatric:     ?   Mood and Affect: Mood normal.  ? ? ?RADIOGRAPHIC STUDIES: ?I have personally reviewed the radiological images as listed and agreed with the findings in the report.  ? ? ? ?LABORATORY DATA:  ?I have reviewed the data as listed ?Lab Results  ?Component Value Date  ? WBC 3.6 (L)  04/23/2021  ? HGB 15.3 04/23/2021  ? HCT 46.6 04/23/2021  ? MCV 94.1 04/23/2021  ? PLT 277 04/23/2021  ? ?Recent Labs  ?  04/23/21 ?1144  ?NA 137  ?K 3.9  ?CL 101  ?CO2 27  ?GLUCOSE 105*  ?BUN 12  ?CREATININE 0.97  ?CALCIUM 9.3  ?GFRNONAA >60  ?PROT 8.0  ?ALBUMIN 4.0  ?AST 31  ?ALT 41  ?ALKPHOS 54  ?BILITOT 0.3  ? ? ?Iron/TIBC/Ferritin/ %Sat ?No results found for: IRON, TIBC, FERRITIN, IRONPCTSAT  ? ?RADIOGRAPHIC STUDIES: ?I have personally reviewed the radiological images as listed and agreed with the findings in the report. ?No results found.  ? ? ?ASSESSMENT & PLAN:  ?1. Leukopenia, unspecified type   ?2. Alcohol use   ? ?Mild leukopenia, ?Slightly decrease of  total white count, no decrease of differential groups. ?Lab results were reviewed and discussed with patient. ?Patient has negative hepatitis, LDH normal, negative HIV, peripheral blood flow cytometry was negative for immunophenotypic abnormality.  Multiple myeloma panel showed negative M protein, polyclonal increase of immunoglobulin.  Normal free light chain ratio.  Normal TSH. ?I think the mild leukopenia is either due to alcohol use/tonic water with quinine.  Encourage patient to cut back. ?No intervention needed. ? ?#Patient will not need to follow-up with hematology at this point.  Recommend patient continue follow-up with primary care provider. ?He can reestablish care in the future if clinically indicated. ?  ?All questions were answered. The patient knows to call the clinic with any problems questions or concerns. ? ?Cc Venia Carbon, MD  ? ?Earlie Server, MD, PhD ?Hematology Oncology ? ?05/24/2021   ?

## 2021-05-24 NOTE — Progress Notes (Signed)
Patient here for follow up. No new concerns voiced.  °

## 2021-06-21 ENCOUNTER — Ambulatory Visit (INDEPENDENT_AMBULATORY_CARE_PROVIDER_SITE_OTHER): Payer: 59 | Admitting: Internal Medicine

## 2021-06-21 ENCOUNTER — Encounter: Payer: Self-pay | Admitting: Internal Medicine

## 2021-06-21 VITALS — BP 134/84 | HR 61 | Temp 97.4°F | Ht 68.5 in | Wt 204.0 lb

## 2021-06-21 DIAGNOSIS — Z125 Encounter for screening for malignant neoplasm of prostate: Secondary | ICD-10-CM

## 2021-06-21 DIAGNOSIS — M79671 Pain in right foot: Secondary | ICD-10-CM

## 2021-06-21 DIAGNOSIS — Z0000 Encounter for general adult medical examination without abnormal findings: Secondary | ICD-10-CM

## 2021-06-21 DIAGNOSIS — J301 Allergic rhinitis due to pollen: Secondary | ICD-10-CM | POA: Diagnosis not present

## 2021-06-21 DIAGNOSIS — M79673 Pain in unspecified foot: Secondary | ICD-10-CM | POA: Insufficient documentation

## 2021-06-21 NOTE — Assessment & Plan Note (Signed)
Needs to get back on allegra till the end of the allergy season

## 2021-06-21 NOTE — Progress Notes (Signed)
Subjective:    Patient ID: Jason Bradley, male    DOB: 11-16-1960, 61 y.o.   MRN: [id number]  HPI Here for physical  Lips are better Went to Dr Yu----nothing concerning about the mildly low WBC  Still has pain in right foot Mostly instep by great toe  Ongoing allergy issues Off meds now--flonase and allegra in the past  Current Outpatient Medications on File Prior to Visit  Medication Sig Dispense Refill   atorvastatin (LIPITOR) 20 MG tablet TAKE 1 TABLET BY MOUTH EVERY DAY 90 tablet 3   cyclobenzaprine (FLEXERIL) 10 MG tablet Take 1 tablet (10 mg total) by mouth 3 (three) times daily as needed for muscle spasms. 30 tablet 0   Multiple Vitamins-Minerals (MULTIVITAMIN MEN PO) Take 1 tablet by mouth daily.     sildenafil (REVATIO) 20 MG tablet Take 3-5 tablets (60-100 mg total) by mouth daily as needed. 50 tablet 11   timolol (TIMOPTIC) 0.5 % ophthalmic solution Place 1 drop into both eyes daily.     No current facility-administered medications on file prior to visit.    Allergies  Allergen Reactions   Bactrim [Sulfamethoxazole-Trimethoprim] Rash   Shellfish Allergy Swelling    Past Medical History:  Diagnosis Date   Glaucoma    Hyperglycemia    Hyperlipemia    Leukopenia    Spinal stenosis of lumbar region with neurogenic claudication     Past Surgical History:  Procedure Laterality Date   HERNIA REPAIR  1972    Family History  Problem Relation Age of Onset   Arthritis Mother    Hypercholesterolemia Mother    Hypertension Mother    Diabetes Mother    Hypertension Sister    Brain cancer Maternal Grandmother    Hearing loss Neg Hx    Cancer Neg Hx     Social History   Socioeconomic History   Marital status: Married    Spouse name: Not on file   Number of children: 3   Years of education: Not on file   Highest education level: Not on file  Occupational History   Occupation: Air traffic controller  Tobacco Use   Smoking status: Never    Passive  exposure: Never   Smokeless tobacco: Never  Vaping Use   Vaping Use: Never used  Substance and Sexual Activity   Alcohol use: Yes    Comment: occasional   Drug use: Never   Sexual activity: Not on file  Other Topics Concern   Not on file  Social History Narrative   Not on file   Social Determinants of Health   Financial Resource Strain: Not on file  Food Insecurity: Not on file  Transportation Needs: Not on file  Physical Activity: Not on file  Stress: Not on file  Social Connections: Not on file  Intimate Partner Violence: Not on file   Review of Systems  Constitutional:  Negative for fatigue and unexpected weight change.       Not much exercise Wears seat belt  HENT:  Negative for dental problem and tinnitus.        Keeps up with dentist  Eyes:  Negative for visual disturbance.       No diplopia or unilateral vision loss  Respiratory:  Negative for cough, chest tightness and shortness of breath.   Cardiovascular:  Negative for chest pain, palpitations and leg swelling.  Gastrointestinal:  Negative for blood in stool and constipation.       No heartburn  Endocrine: Negative for polydipsia  and polyuria.  Genitourinary:  Negative for difficulty urinating and urgency.       ED better---hasn't needed the sildenafil  Musculoskeletal:        Episodic low back pain---if leans/strains too much  Skin:  Negative for rash.  Allergic/Immunologic: Positive for environmental allergies. Negative for immunocompromised state.  Neurological:  Negative for dizziness, tremors, light-headedness and headaches.  Hematological:  Negative for adenopathy. Does not bruise/bleed easily.  Psychiatric/Behavioral:  Negative for dysphoric mood and sleep disturbance. The patient is not nervous/anxious.       Objective:   Physical Exam Constitutional:      Appearance: Normal appearance.  HENT:     Mouth/Throat:     Pharynx: No oropharyngeal exudate or posterior oropharyngeal erythema.  Eyes:      Conjunctiva/sclera: Conjunctivae normal.     Pupils: Pupils are equal, round, and reactive to light.  Cardiovascular:     Rate and Rhythm: Normal rate and regular rhythm.     Pulses: Normal pulses.     Heart sounds: No murmur heard.   No gallop.  Pulmonary:     Effort: Pulmonary effort is normal.     Breath sounds: Normal breath sounds. No wheezing or rales.  Abdominal:     Palpations: Abdomen is soft.     Tenderness: There is no abdominal tenderness.  Musculoskeletal:     Cervical back: Neck supple.     Right lower leg: No edema.     Left lower leg: No edema.     Comments: Decreased arches No plantar lesions  Lymphadenopathy:     Cervical: No cervical adenopathy.  Skin:    Findings: No lesion or rash.  Neurological:     General: No focal deficit present.     Mental Status: He is alert and oriented to person, place, and time.  Psychiatric:        Mood and Affect: Mood normal.        Behavior: Behavior normal.           Assessment & Plan:

## 2021-06-21 NOTE — Patient Instructions (Signed)
Please get your screening colonoscopy set up with Dr Benson Norway.

## 2021-06-21 NOTE — Assessment & Plan Note (Signed)
Seems to be mechanical Discussed getting arch inserts Can set up with Dr Lorelei Pont if ongoing problems

## 2021-06-21 NOTE — Assessment & Plan Note (Signed)
Healthy Discussed working on fitness COVID vaccine and flu by the fall He will consider shingrix Due for colonoscopy with Dr Jonelle Sidle will set up Discussed PSA---will check

## 2021-06-22 LAB — PSA: PSA: 4.58 ng/mL — ABNORMAL HIGH (ref 0.10–4.00)

## 2021-06-28 ENCOUNTER — Other Ambulatory Visit: Payer: Self-pay | Admitting: Internal Medicine

## 2021-06-28 DIAGNOSIS — R972 Elevated prostate specific antigen [PSA]: Secondary | ICD-10-CM

## 2021-07-05 ENCOUNTER — Telehealth: Payer: Self-pay

## 2021-07-05 NOTE — Telephone Encounter (Signed)
Labs faxed to [facility]

## 2021-07-05 NOTE — Telephone Encounter (Signed)
Patient's wife is calling in asking if they can come by and pick up a copy of the last 3 PSA results so they can give it to the urologist at there appointment on 6/13.

## 2021-07-17 ENCOUNTER — Ambulatory Visit: Payer: Self-pay | Admitting: Urology

## 2021-08-17 ENCOUNTER — Other Ambulatory Visit: Payer: Self-pay | Admitting: Urology

## 2021-08-17 DIAGNOSIS — R972 Elevated prostate specific antigen [PSA]: Secondary | ICD-10-CM

## 2021-09-04 ENCOUNTER — Ambulatory Visit
Admission: RE | Admit: 2021-09-04 | Discharge: 2021-09-04 | Disposition: A | Discharge status: Home / Self Care | Payer: 59 | Source: Ambulatory Visit | Attending: Urology | Admitting: Urology

## 2021-09-04 DIAGNOSIS — R972 Elevated prostate specific antigen [PSA]: Secondary | ICD-10-CM

## 2021-09-04 MED ORDER — GADOBENATE DIMEGLUMINE 529 MG/ML IV SOLN
20.0000 mL | Freq: Once | INTRAVENOUS | Status: AC | PRN
  Administered 2021-09-04: 20 mL via INTRAVENOUS

## 2021-09-24 ENCOUNTER — Other Ambulatory Visit: Payer: Self-pay | Admitting: Internal Medicine

## 2021-11-30 ENCOUNTER — Ambulatory Visit (INDEPENDENT_AMBULATORY_CARE_PROVIDER_SITE_OTHER): Payer: 59

## 2021-11-30 DIAGNOSIS — Z23 Encounter for immunization: Secondary | ICD-10-CM | POA: Diagnosis not present

## 2022-05-16 LAB — HM COLONOSCOPY

## 2022-06-25 ENCOUNTER — Ambulatory Visit (INDEPENDENT_AMBULATORY_CARE_PROVIDER_SITE_OTHER): Payer: 59 | Admitting: Internal Medicine

## 2022-06-25 ENCOUNTER — Encounter: Payer: Self-pay | Admitting: Internal Medicine

## 2022-06-25 VITALS — BP 136/86 | HR 60 | Temp 96.8°F | Ht 69.0 in | Wt 211.0 lb

## 2022-06-25 DIAGNOSIS — Z23 Encounter for immunization: Secondary | ICD-10-CM

## 2022-06-25 DIAGNOSIS — Z0000 Encounter for general adult medical examination without abnormal findings: Secondary | ICD-10-CM

## 2022-06-25 DIAGNOSIS — E785 Hyperlipidemia, unspecified: Secondary | ICD-10-CM

## 2022-06-25 LAB — COMPREHENSIVE METABOLIC PANEL
ALT: 34 U/L (ref 0–53)
AST: 29 U/L (ref 0–37)
Albumin: 4.1 g/dL (ref 3.5–5.2)
Alkaline Phosphatase: 50 U/L (ref 39–117)
BUN: 14 mg/dL (ref 6–23)
CO2: 29 mEq/L (ref 19–32)
Calcium: 9.2 mg/dL (ref 8.4–10.5)
Chloride: 100 mEq/L (ref 96–112)
Creatinine, Ser: 0.93 mg/dL (ref 0.40–1.50)
GFR: 87.04 mL/min (ref >60.00)
Glucose, Bld: 94 mg/dL (ref 70–99)
Potassium: 3.9 mEq/L (ref 3.5–5.1)
Sodium: 134 mEq/L — ABNORMAL LOW (ref 135–145)
Total Bilirubin: 0.4 mg/dL (ref 0.2–1.2)
Total Protein: 7.3 g/dL (ref 6.0–8.3)

## 2022-06-25 LAB — LIPID PANEL
Cholesterol: 184 mg/dL (ref 0–200)
HDL: 69.1 mg/dL (ref >39.00)
LDL Cholesterol: 104 mg/dL — ABNORMAL HIGH (ref 0–99)
NonHDL: 114.45
Total CHOL/HDL Ratio: 3
Triglycerides: 54 mg/dL (ref 0.0–149.0)
VLDL: 10.8 mg/dL (ref 0.0–40.0)

## 2022-06-25 LAB — CBC
HCT: 44.4 % (ref 39.0–52.0)
Hemoglobin: 14.5 g/dL (ref 13.0–17.0)
MCHC: 32.6 g/dL (ref 30.0–36.0)
MCV: 95.7 fl (ref 78.0–100.0)
Platelets: 239 10*3/uL (ref 150.0–400.0)
RBC: 4.64 Mil/uL (ref 4.22–5.81)
RDW: 13.7 % (ref 11.5–15.5)
WBC: 3.2 10*3/uL — ABNORMAL LOW (ref 4.0–10.5)

## 2022-06-25 MED ORDER — SILDENAFIL CITRATE 20 MG PO TABS: 60.0000 mg | ORAL_TABLET | Freq: Every day | ORAL | 11 refills | Status: DC | PRN

## 2022-06-25 NOTE — Progress Notes (Signed)
Subjective:    Patient ID: Jason Bradley, male    DOB: 01/28/1961, 62 y.o.   MRN: [id number]  HPI Here for physical  Has had rash on top of both feet--near ankles Does have new work boots Some better now  Saw urologist for elevated PSA MRI reassuring --but keeping follow up   Did have colonoscopy--7 year recall  Retiring next month  Current Outpatient Medications on File Prior to Visit  Medication Sig Dispense Refill   atorvastatin (LIPITOR) 20 MG tablet TAKE 1 TABLET BY MOUTH EVERY DAY 90 tablet 3   cyclobenzaprine (FLEXERIL) 10 MG tablet Take 1 tablet (10 mg total) by mouth 3 (three) times daily as needed for muscle spasms. 30 tablet 0   Multiple Vitamins-Minerals (MULTIVITAMIN MEN PO) Take 1 tablet by mouth daily.     sildenafil (REVATIO) 20 MG tablet Take 3-5 tablets (60-100 mg total) by mouth daily as needed. 50 tablet 11   timolol (TIMOPTIC) 0.5 % ophthalmic solution Place 1 drop into both eyes daily. (Patient not taking: Reported on 06/25/2022)     No current facility-administered medications on file prior to visit.    Allergies  Allergen Reactions   Bactrim [Sulfamethoxazole-Trimethoprim] Rash   Shellfish Allergy Swelling    Past Medical History:  Diagnosis Date   Glaucoma    Hyperglycemia    Hyperlipemia    Leukopenia    Spinal stenosis of lumbar region with neurogenic claudication     Past Surgical History:  Procedure Laterality Date   HERNIA REPAIR  1972    Family History  Problem Relation Age of Onset   Arthritis Mother    Hypercholesterolemia Mother    Hypertension Mother    Diabetes Mother    Hypertension Sister    Brain cancer Maternal Grandmother    Hearing loss Neg Hx    Cancer Neg Hx     Social History   Socioeconomic History   Marital status: Married    Spouse name: Not on file   Number of children: 3   Years of education: Not on file   Highest education level: Not on file  Occupational History   Occupation: Armed forces training and education officer   Tobacco Use   Smoking status: Never    Passive exposure: Never   Smokeless tobacco: Never  Vaping Use   Vaping Use: Never used  Substance and Sexual Activity   Alcohol use: Yes    Comment: occasional   Drug use: Never   Sexual activity: Not on file  Other Topics Concern   Not on file  Social History Narrative   Not on file   Social Determinants of Health   Financial Resource Strain: Not on file  Food Insecurity: Not on file  Transportation Needs: Not on file  Physical Activity: Not on file  Stress: Not on file  Social Connections: Not on file  Intimate Partner Violence: Not on file   Review of Systems  Constitutional:        Does exercise--walk/bike Weight fairly stable Wears seat belt  HENT:  Negative for dental problem, hearing loss, tinnitus and trouble swallowing.        Keeps up with dentist  Eyes:  Negative for visual disturbance.       No diplopia or unilateral vision loss  Respiratory:  Negative for cough, chest tightness and shortness of breath.   Cardiovascular:  Negative for chest pain, palpitations and leg swelling.  Gastrointestinal:  Negative for blood in stool and constipation.  Some reflux---hasn't needed prevacid in a long time  Endocrine: Negative for polydipsia and polyuria.  Genitourinary:  Negative for urgency.       Can have slow flow--like after in car for awhile Flow fine otherwise Satisfied with sildenafil 20mg   Musculoskeletal:  Negative for arthralgias and joint swelling.       Some low back pain---stretching will help  Skin:        No suspicious lesions  Allergic/Immunologic: Positive for environmental allergies. Negative for immunocompromised state.       Used allergra for tree pollen  Neurological:  Negative for dizziness, syncope, light-headedness and headaches.  Hematological:  Negative for adenopathy. Does not bruise/bleed easily.  Psychiatric/Behavioral:  Negative for dysphoric mood and sleep disturbance. The patient is not  nervous/anxious.        Objective:   Physical Exam Constitutional:      Appearance: Normal appearance.  HENT:     Mouth/Throat:     Pharynx: No oropharyngeal exudate or posterior oropharyngeal erythema.  Eyes:     Conjunctiva/sclera: Conjunctivae normal.     Pupils: Pupils are equal, round, and reactive to light.  Cardiovascular:     Rate and Rhythm: Normal rate and regular rhythm.     Pulses: Normal pulses.     Heart sounds: No murmur heard.    No gallop.  Pulmonary:     Effort: Pulmonary effort is normal.     Breath sounds: Normal breath sounds. No wheezing or rales.  Abdominal:     Palpations: Abdomen is soft.     Tenderness: There is no abdominal tenderness.  Musculoskeletal:     Cervical back: Neck supple.     Right lower leg: No edema.     Left lower leg: No edema.  Lymphadenopathy:     Cervical: No cervical adenopathy.  Skin:    Findings: No lesion.     Comments: Hyperpigmentation on dorsum of feet near ankles (discussed this is probably pressure from the new boots)  Neurological:     General: No focal deficit present.     Mental Status: He is alert and oriented to person, place, and time.  Psychiatric:        Mood and Affect: Mood normal.        Behavior: Behavior normal.            Assessment & Plan:

## 2022-06-25 NOTE — Addendum Note (Signed)
Addended by: Eual Fines on: 06/25/2022 09:55 AM   Modules accepted: Orders

## 2022-06-25 NOTE — Assessment & Plan Note (Signed)
Healthy Working on fitness Colon due again in 7 years Getting PSA at urologist due to elevation last year Td booster today Prefers no shingrix Will consider updated COVID Flu vaccine in the fall

## 2022-06-25 NOTE — Assessment & Plan Note (Signed)
No problems with atorvastatin for primary prevention

## 2022-09-21 ENCOUNTER — Other Ambulatory Visit: Payer: Self-pay | Admitting: Internal Medicine

## 2022-10-31 ENCOUNTER — Ambulatory Visit: Payer: 59

## 2022-11-14 ENCOUNTER — Ambulatory Visit (INDEPENDENT_AMBULATORY_CARE_PROVIDER_SITE_OTHER): Payer: 59

## 2022-11-14 DIAGNOSIS — Z23 Encounter for immunization: Secondary | ICD-10-CM | POA: Diagnosis not present

## 2023-07-01 ENCOUNTER — Encounter: Payer: 59 | Admitting: Internal Medicine

## 2023-07-18 ENCOUNTER — Encounter: Payer: Self-pay | Admitting: Internal Medicine

## 2023-07-21 ENCOUNTER — Encounter: Payer: Self-pay | Admitting: Internal Medicine

## 2023-07-21 ENCOUNTER — Ambulatory Visit (INDEPENDENT_AMBULATORY_CARE_PROVIDER_SITE_OTHER): Payer: Self-pay | Admitting: Internal Medicine

## 2023-07-21 VITALS — BP 138/86 | HR 69 | Temp 98.4°F | Ht 68.75 in | Wt 222.0 lb

## 2023-07-21 DIAGNOSIS — Z23 Encounter for immunization: Secondary | ICD-10-CM | POA: Diagnosis not present

## 2023-07-21 DIAGNOSIS — M48062 Spinal stenosis, lumbar region with neurogenic claudication: Secondary | ICD-10-CM | POA: Diagnosis not present

## 2023-07-21 DIAGNOSIS — R200 Anesthesia of skin: Secondary | ICD-10-CM

## 2023-07-21 DIAGNOSIS — Z0000 Encounter for general adult medical examination without abnormal findings: Secondary | ICD-10-CM | POA: Diagnosis not present

## 2023-07-21 DIAGNOSIS — E785 Hyperlipidemia, unspecified: Secondary | ICD-10-CM | POA: Diagnosis not present

## 2023-07-21 DIAGNOSIS — H9190 Unspecified hearing loss, unspecified ear: Secondary | ICD-10-CM

## 2023-07-21 DIAGNOSIS — J301 Allergic rhinitis due to pollen: Secondary | ICD-10-CM

## 2023-07-21 MED ORDER — SILDENAFIL CITRATE 20 MG PO TABS: 60.0000 mg | ORAL_TABLET | Freq: Every day | ORAL | 11 refills | Status: AC | PRN

## 2023-07-21 NOTE — Assessment & Plan Note (Signed)
No problems with primary prevention with atorvastatin 20 ?

## 2023-07-21 NOTE — Progress Notes (Addendum)
 Subjective:    Jason Bradley is a 63 y.o. male who presents for a Welcome to Medicare exam.   Cardiac Risk Factors include: advanced age (>64men, >50 women)     Objective:    Today's Vitals   07/21/23 0924  BP: 138/86  Pulse: 69  Temp: 98.4 F (36.9 C)  TempSrc: Oral  SpO2: 98%  Weight: 222 lb (100.7 kg)  Height: 5' 8.75 (1.746 m)   Body mass index is 33.02 kg/m.  Medications Outpatient Encounter Medications as of 07/21/2023  Medication Sig   atorvastatin  (LIPITOR) 20 MG tablet TAKE 1 TABLET BY MOUTH EVERY DAY   cyclobenzaprine  (FLEXERIL ) 10 MG tablet Take 1 tablet (10 mg total) by mouth 3 (three) times daily as needed for muscle spasms.   latanoprost (XALATAN) 0.005 % ophthalmic solution 1 drop at bedtime.   Multiple Vitamins-Minerals (MULTIVITAMIN MEN PO) Take 1 tablet by mouth daily.   sildenafil  (REVATIO ) 20 MG tablet Take 3-5 tablets (60-100 mg total) by mouth daily as needed.   timolol (TIMOPTIC) 0.5 % ophthalmic solution Place 1 drop into both eyes daily.   No facility-administered encounter medications on file as of 07/21/2023.     History: Past Medical History:  Diagnosis Date   Glaucoma    Hyperglycemia    Hyperlipemia    Leukopenia    Spinal stenosis of lumbar region with neurogenic claudication    Past Surgical History:  Procedure Laterality Date   HERNIA REPAIR  1972   LIPOMA EXCISION  2021    Family History  Problem Relation Age of Onset   Arthritis Mother    Hypercholesterolemia Mother    Hypertension Mother    Diabetes Mother    Hypertension Sister    Brain cancer Maternal Grandmother    Hearing loss Neg Hx    Cancer Neg Hx    Social History   Occupational History   Occupation: Armed forces training and education officer    End: 05/2022   Occupation: retired  Tobacco Use   Smoking status: Never    Passive exposure: Never   Smokeless tobacco: Never  Vaping Use   Vaping status: Never Used  Substance and Sexual Activity   Alcohol use: Yes     Alcohol/week: 4.0 standard drinks of alcohol    Types: 2 Glasses of wine, 2 Cans of beer per week    Comment: occasional   Drug use: Never   Sexual activity: Yes    Tobacco Counseling Counseling given: Not Answered   Immunizations and Health Maintenance Immunization History  Administered Date(s) Administered   Influenza, Seasonal, Injecte, Preservative Fre 11/14/2022   Influenza,inj,Quad PF,6+ Mos 12/10/2017, 10/13/2018, 10/27/2019, 11/03/2020, 11/30/2021   Influenza-Unspecified 12/06/2016   Moderna Sars-Covid-2 Vaccination 04/10/2019, 05/08/2019, 01/21/2020   Td 02/05/2004, 06/25/2022   Tdap 12/16/2012   There are no preventive care reminders to display for this patient.   Activities of Daily Living    07/18/2023    3:59 PM  In your present state of health, do you have any difficulty performing the following activities:  Hearing? 0  Vision? 0  Difficulty concentrating or making decisions? 0  Walking or climbing stairs? 0  Dressing or bathing? 0  Doing errands, shopping? 0  Preparing Food and eating ? N  Using the Toilet? N  In the past six months, have you accidently leaked urine? N  Do you have problems with loss of bowel control? N  Managing your Medications? N  Managing your Finances? N  Housekeeping or managing your Housekeeping? N  Physical Exam   Physical Exam (optional), or other factors deemed appropriate based on the beneficiary's medical and social history and current clinical standards.   Advanced Directives: Does Patient Have a Medical Advance Directive?: Yes Type of Advance Directive: Healthcare Power of Attorney, Living will Does patient want to make changes to medical advance directive?: No - Patient declined Copy of Healthcare Power of Attorney in Chart?: No - copy requested   EKG:  normal EKG,  sinus bradycardia     Assessment:    This is a routine wellness  examination for this patient .    Vision/Hearing screen Hearing Screening    500Hz  1000Hz  2000Hz  4000Hz   Right ear 20 20 20 20   Left ear 20 20 20 20    Vision Screening   Right eye Left eye Both eyes  Without correction     With correction 20/25 20/30 20/25      Goals       Exercise 3x per week (30 min per time) (pt-stated)         Depression Screen    07/21/2023    9:32 AM 07/18/2023    4:12 PM 06/25/2022    8:37 AM 06/21/2021    3:41 PM  PHQ 2/9 Scores  PHQ - 2 Score 0 0 0 0     Fall Risk    07/21/2023    9:32 AM  Fall Risk   Falls in the past year? 0  Number falls in past yr: 0  Injury with Fall? 0  Risk for fall due to : No Fall Risks  Follow up Falls evaluation completed    Cognitive Function        07/18/2023    4:16 PM  6CIT Screen  What Year? 0 points  What month? 0 points  What time? 0 points  Count back from 20 0 points  Months in reverse 0 points  Repeat phrase 0 points  Total Score 0 points    Patient Care Team: Helaine Llanos, MD as PCP - General (Internal Medicine) Timmy Forbes, MD as Consulting Physician (Hematology)     Plan:    See other note  I have personally reviewed and noted the following in the patient's chart:   Medical and social history Use of alcohol, tobacco or illicit drugs  Current medications and supplements including opioid prescriptions. Patient is not currently taking opioid prescriptions. Functional ability and status Nutritional status Physical activity Advanced directives List of other physicians Hospitalizations, surgeries, and ER visits in previous 12 months Vitals Screenings to include cognitive, depression, and falls Referrals and appointments  In addition, I have reviewed and discussed with patient certain preventive protocols, quality metrics, and best practice recommendations. A written personalized care plan for preventive services as well as general preventive health recommendations were provided to patient.     Franne Ivory, CMA 07/21/2023

## 2023-07-21 NOTE — Assessment & Plan Note (Signed)
 Hasn't needed meds lately

## 2023-07-21 NOTE — Assessment & Plan Note (Signed)
 Discussed biking, swimming as exercise alternatives Not ready for surgical evaluation

## 2023-07-21 NOTE — Addendum Note (Signed)
 Addended by: Curt Dover I on: 07/21/2023 10:22 AM   Modules accepted: Orders

## 2023-07-21 NOTE — Progress Notes (Signed)
 Subjective:    Patient ID: Jason Bradley, male    DOB: 04-14-1960, 63 y.o.   MRN: [id number]  HPI Here for Welcome to Medicare visit and follow up of chronic health conditions Reviewed advanced directives Reviewed other doctors---Dr Winters--urology ([facility]), Dr Vladimir Groves, Dr Isela Marchi Doing some walking--but still gained 10# Vision is fine Poor hearing Occasional bourbon or beer No tobacco No falls No depression or anhedonia Independent with instrumental ADLS No memory issues  Bored at times with retirement--but is working 2 days per week Working on activities  Has trouble with his hearing Discussed audiology  Low back pain continues Some numbness on bottom of right foot Gets numbness in left leg with walking--has to bend forward and it will improve Hasn't tried tylenol  or other medications Thinking about trying chiropractic  Continues on the atorvastatin  No problems with this  Allergy symptoms have been better Not needing medications lately  Will be seeing urologist next month Follow up on elevated PSA  Current Outpatient Medications on File Prior to Visit  Medication Sig Dispense Refill   atorvastatin  (LIPITOR) 20 MG tablet TAKE 1 TABLET BY MOUTH EVERY DAY 90 tablet 3   cyclobenzaprine  (FLEXERIL ) 10 MG tablet Take 1 tablet (10 mg total) by mouth 3 (three) times daily as needed for muscle spasms. 30 tablet 0   latanoprost (XALATAN) 0.005 % ophthalmic solution 1 drop at bedtime.     Multiple Vitamins-Minerals (MULTIVITAMIN MEN PO) Take 1 tablet by mouth daily.     sildenafil  (REVATIO ) 20 MG tablet Take 3-5 tablets (60-100 mg total) by mouth daily as needed. 50 tablet 11   timolol (TIMOPTIC) 0.5 % ophthalmic solution Place 1 drop into both eyes daily.     No current facility-administered medications on file prior to visit.    Allergies  Allergen Reactions   Bactrim  [Sulfamethoxazole -Trimethoprim ] Rash   Shellfish Allergy Swelling    Past Medical  History:  Diagnosis Date   Glaucoma    Hyperglycemia    Hyperlipemia    Leukopenia    Spinal stenosis of lumbar region with neurogenic claudication     Past Surgical History:  Procedure Laterality Date   HERNIA REPAIR  1972   LIPOMA EXCISION  2021    Family History  Problem Relation Age of Onset   Arthritis Mother    Hypercholesterolemia Mother    Hypertension Mother    Diabetes Mother    Hypertension Sister    Brain cancer Maternal Grandmother    Hearing loss Neg Hx    Cancer Neg Hx     Social History   Socioeconomic History   Marital status: Married    Spouse name: Not on file   Number of children: 3   Years of education: Not on file   Highest education level: Not on file  Occupational History   Occupation: Maintenance technician    End: 05/2022   Occupation: retired  Tobacco Use   Smoking status: Never    Passive exposure: Never   Smokeless tobacco: Never  Vaping Use   Vaping status: Never Used  Substance and Sexual Activity   Alcohol use: Yes    Alcohol/week: 4.0 standard drinks of alcohol    Types: 2 Glasses of wine, 2 Cans of beer per week    Comment: occasional   Drug use: Never   Sexual activity: Yes  Other Topics Concern   Not on file  Social History Narrative   Has living will   Wife is health care POA---alternate is oldest  daughter Shirlie Dove   Would accept resuscitation and tube feeds if necessary (but not sure about long term--would leave it to family)   Social Drivers of Health   Financial Resource Strain: Low Risk  (07/18/2023)   Overall Financial Resource Strain (CARDIA)    Difficulty of Paying Living Expenses: Not hard at all  Food Insecurity: No Food Insecurity (07/18/2023)   Hunger Vital Sign    Worried About Running Out of Food in the Last Year: Never true    Ran Out of Food in the Last Year: Never true  Transportation Needs: No Transportation Needs (07/18/2023)   PRAPARE - Administrator, Civil Service (Medical): No     Lack of Transportation (Non-Medical): No  Physical Activity: Sufficiently Active (07/18/2023)   Exercise Vital Sign    Days of Exercise per Week: 5 days    Minutes of Exercise per Session: 60 min  Stress: No Stress Concern Present (07/18/2023)   [employer] of Occupational Health - Occupational Stress Questionnaire    Feeling of Stress: Not at all  Social Connections: Moderately Integrated (07/18/2023)   Social Connection and Isolation Panel    Frequency of Communication with Friends and Family: More than three times a week    Frequency of Social Gatherings with Friends and Family: Twice a week    Attends Religious Services: 1 to 4 times per year    Active Member of [employer] or Organizations: No    Attends Banker Meetings: Never    Marital Status: Married  Catering manager Violence: Not At Risk (07/18/2023)   Humiliation, Afraid, Rape, and Kick questionnaire    Fear of Current or Ex-Partner: No    Emotionally Abused: No    Physically Abused: No    Sexually Abused: No   Review of Systems Appetite is fine Generally sleeps okay Wears seat belt Teeth are fine--keeps up with dentist No suspicious skin lesions No chest pain or SOB No dizziness or syncope Bowels move fine---no blood Voids okay--stream is good Occasional acid reflux---no recent meds. Prevacid in the past. No dysphagia No other sig joint issues    Objective:   Physical Exam Constitutional:      Appearance: Normal appearance.  HENT:     Mouth/Throat:     Pharynx: No oropharyngeal exudate or posterior oropharyngeal erythema.   Eyes:     Conjunctiva/sclera: Conjunctivae normal.     Pupils: Pupils are equal, round, and reactive to light.    Cardiovascular:     Rate and Rhythm: Normal rate and regular rhythm.     Pulses: Normal pulses.     Heart sounds: No murmur heard.    No gallop.  Pulmonary:     Effort: Pulmonary effort is normal.     Breath sounds: Normal breath sounds. No wheezing or  rales.  Abdominal:     Palpations: Abdomen is soft.     Tenderness: There is no abdominal tenderness.   Musculoskeletal:     Cervical back: Neck supple.     Right lower leg: No edema.     Left lower leg: No edema.  Lymphadenopathy:     Cervical: No cervical adenopathy.   Skin:    Findings: No lesion or rash.   Neurological:     General: No focal deficit present.     Mental Status: He is alert and oriented to person, place, and time.   Psychiatric:        Mood and Affect: Mood normal.  Behavior: Behavior normal.            Assessment & Plan:

## 2023-07-21 NOTE — Assessment & Plan Note (Signed)
 Will go to audiology for evaluation

## 2023-07-21 NOTE — Assessment & Plan Note (Signed)
 I have personally reviewed the Medicare Annual Wellness questionnaire and have noted 1. The patient's medical and social history 2. Their use of alcohol, tobacco or illicit drugs 3. Their current medications and supplements 4. The patient's functional ability including ADL's, fall risks, home safety risks and hearing or visual             impairment. 5. Diet and physical activities 6. Evidence for depression or mood disorders  The patients weight, height, BMI and visual acuity have been recorded in the chart I have made referrals, counseling and provided education to the patient based review of the above and I have provided the pt with a written personalized care plan for preventive services.  I have provided you with a copy of your personalized plan for preventive services. Please take the time to review along with your updated medication list.  Colon due 2021 Getting PSA at urologist Discussed exercise Prevnar 20 today Flu vaccine in the fall--prefers no COVID update

## 2023-07-21 NOTE — Assessment & Plan Note (Signed)
 May be from spinal stenosis--but will check labs

## 2023-07-21 NOTE — Addendum Note (Signed)
 Addended by: Franne Ivory on: 07/21/2023 11:52 AM   Modules accepted: Orders

## 2023-07-22 ENCOUNTER — Ambulatory Visit: Payer: Self-pay | Admitting: Internal Medicine

## 2023-07-22 LAB — COMPREHENSIVE METABOLIC PANEL WITH GFR
AG Ratio: 1.6 (calc) (ref 1.0–2.5)
ALT: 27 U/L (ref 9–46)
AST: 21 U/L (ref 10–35)
Albumin: 4.3 g/dL (ref 3.6–5.1)
Alkaline phosphatase (APISO): 50 U/L (ref 35–144)
BUN: 11 mg/dL (ref 7–25)
CO2: 28 mmol/L (ref 20–32)
Calcium: 9.3 mg/dL (ref 8.6–10.3)
Chloride: 104 mmol/L (ref 98–110)
Creat: 0.9 mg/dL (ref 0.70–1.35)
Globulin: 2.7 g/dL (ref 1.9–3.7)
Glucose, Bld: 104 mg/dL — ABNORMAL HIGH (ref 65–99)
Potassium: 4.3 mmol/L (ref 3.5–5.3)
Sodium: 140 mmol/L (ref 135–146)
Total Bilirubin: 0.5 mg/dL (ref 0.2–1.2)
Total Protein: 7 g/dL (ref 6.1–8.1)
eGFR: 95 mL/min/{1.73_m2} (ref >=60)

## 2023-07-22 LAB — LIPID PANEL
Cholesterol: 176 mg/dL (ref <200)
HDL: 57 mg/dL (ref >=40)
LDL Cholesterol (Calc): 100 mg/dL — ABNORMAL HIGH
Non-HDL Cholesterol (Calc): 119 mg/dL (ref <130)
Total CHOL/HDL Ratio: 3.1 (calc) (ref <5.0)
Triglycerides: 98 mg/dL (ref <150)

## 2023-07-22 LAB — CBC
HCT: 49.2 % (ref 38.5–50.0)
Hemoglobin: 15.8 g/dL (ref 13.2–17.1)
MCH: 30.6 pg (ref 27.0–33.0)
MCHC: 32.1 g/dL (ref 32.0–36.0)
MCV: 95.3 fL (ref 80.0–100.0)
MPV: 11.1 fL (ref 7.5–12.5)
Platelets: 241 10*3/uL (ref 140–400)
RBC: 5.16 10*6/uL (ref 4.20–5.80)
RDW: 12.2 % (ref 11.0–15.0)
WBC: 3.2 10*3/uL — ABNORMAL LOW (ref 3.8–10.8)

## 2023-07-22 LAB — TSH: TSH: 1.1 m[IU]/L (ref 0.40–4.50)

## 2023-07-22 LAB — VITAMIN B12: Vitamin B-12: 602 pg/mL (ref 200–1100)

## 2023-07-31 ENCOUNTER — Telehealth: Payer: Self-pay

## 2023-07-31 NOTE — Telephone Encounter (Signed)
 Called patient and LVM stating we are needing more insurance info.  The insurance that is on file is Medicare Part A only and we are needing more insurance info so the patient will not receive a bill.  Patient can email me a copy of the insurance card so I can add it to his profile or the patient can come into the office with the insurance card so we can add it to his profile.

## 2023-08-26 ENCOUNTER — Other Ambulatory Visit: Payer: Self-pay | Admitting: Urology

## 2023-08-26 DIAGNOSIS — R972 Elevated prostate specific antigen [PSA]: Secondary | ICD-10-CM

## 2023-09-11 ENCOUNTER — Encounter: Payer: Self-pay | Admitting: Internal Medicine

## 2023-09-15 ENCOUNTER — Encounter: Payer: Self-pay | Admitting: Internal Medicine

## 2023-09-30 ENCOUNTER — Other Ambulatory Visit: Payer: Self-pay

## 2023-10-31 ENCOUNTER — Other Ambulatory Visit: Payer: Self-pay

## 2023-11-23 ENCOUNTER — Other Ambulatory Visit: Payer: Self-pay

## 2023-11-27 ENCOUNTER — Ambulatory Visit
Admission: RE | Admit: 2023-11-27 | Discharge: 2023-11-27 | Disposition: A | Discharge status: Home / Self Care | Payer: Self-pay | Source: Ambulatory Visit | Attending: Urology | Admitting: Urology

## 2023-11-27 DIAGNOSIS — R972 Elevated prostate specific antigen [PSA]: Secondary | ICD-10-CM

## 2023-11-27 MED ORDER — GADOPICLENOL 0.5 MMOL/ML IV SOLN
10.0000 mL | Freq: Once | INTRAVENOUS | Status: AC | PRN
  Administered 2023-11-27: 10 mL via INTRAVENOUS

## 2023-12-26 ENCOUNTER — Ambulatory Visit: Admitting: Podiatry

## 2023-12-26 ENCOUNTER — Encounter: Payer: Self-pay | Admitting: Podiatry

## 2023-12-26 ENCOUNTER — Ambulatory Visit

## 2023-12-26 VITALS — Ht 68.0 in | Wt 222.0 lb

## 2023-12-26 DIAGNOSIS — M7751 Other enthesopathy of right foot: Secondary | ICD-10-CM

## 2023-12-26 DIAGNOSIS — M7752 Other enthesopathy of left foot: Secondary | ICD-10-CM

## 2023-12-28 NOTE — Progress Notes (Signed)
   Chief Complaint  Patient presents with   Foot Pain    Pt is here due to numbness to the bottom of both feet, this has been happening for about a year, states he does a lot of walking, he is not a diabetic.     HPI: 63 y.o. male presenting today for evaluation of numbness to the bilateral feet ongoing for about 1 year.  Aggravated by certain activities and with certain shoes  Past Medical History:  Diagnosis Date   Glaucoma    Hyperglycemia    Hyperlipemia    Leukopenia    Spinal stenosis of lumbar region with neurogenic claudication     Past Surgical History:  Procedure Laterality Date   HERNIA REPAIR  1972   LIPOMA EXCISION  2021    Allergies  Allergen Reactions   Bactrim  [Sulfamethoxazole -Trimethoprim ] Rash   Shellfish Allergy Swelling     Physical Exam: General: The patient is alert and oriented x3 in no acute distress.  Dermatology: Skin is warm, dry and supple bilateral lower extremities.   Vascular: Palpable pedal pulses bilaterally. Capillary refill within normal limits.  No appreciable edema.  No erythema.  Neurological: Grossly intact via light touch  Musculoskeletal Exam: No pedal deformities noted  Radiographic Exam B/L feet 12/26/2023:  Normal osseous mineralization. Joint spaces preserved.  No fractures or osseous irregularities noted.  Impression: Negative  Assessment/Plan of Care: 1.  Intermittent paresthesia to the feet bilateral secondary to activity  -Patient evaluated.  X-rays reviewed -Recommend conservative treatment and management including good supportive tennis shoes and sneakers that do not irritate or constrict the toebox area.  Refrain from going barefoot -Return to clinic PRN      Thresa EMERSON Sar, DPM [facility]  Dr. Thresa EMERSON Sar, DPM    [address] [place], [place] [address]                Office [phone]  Fax [phone]

## 2024-02-09 ENCOUNTER — Other Ambulatory Visit: Payer: Self-pay

## 2024-02-10 MED ORDER — ATORVASTATIN CALCIUM 20 MG PO TABS: 20.0000 mg | ORAL_TABLET | Freq: Every day | ORAL | 3 refills | Status: AC

## 2024-03-18 ENCOUNTER — Encounter
